# Patient Record
Sex: Male | Born: 1975 | Race: Black or African American | Hispanic: No | Marital: Married | Smoking: Never smoker
Health system: Southern US, Community
[De-identification: ages and names within clinical notes are randomized; demographics above are authoritative.]

## PROBLEM LIST (undated history)

## (undated) DIAGNOSIS — I10 Essential (primary) hypertension: Secondary | ICD-10-CM

## (undated) DIAGNOSIS — E7800 Pure hypercholesterolemia, unspecified: Secondary | ICD-10-CM

## (undated) DIAGNOSIS — I219 Acute myocardial infarction, unspecified: Secondary | ICD-10-CM

## (undated) DIAGNOSIS — R918 Other nonspecific abnormal finding of lung field: Secondary | ICD-10-CM

## (undated) DIAGNOSIS — J309 Allergic rhinitis, unspecified: Secondary | ICD-10-CM

## (undated) HISTORY — DX: Other nonspecific abnormal finding of lung field: R91.8

## (undated) HISTORY — DX: Pure hypercholesterolemia, unspecified: E78.00

## (undated) HISTORY — DX: Allergic rhinitis, unspecified: J30.9

---

## 2007-03-23 HISTORY — PX: LUNG BIOPSY: SHX232

## 2008-03-22 HISTORY — PX: CARDIAC CATHETERIZATION: SHX172

## 2008-03-22 HISTORY — PX: OTHER SURGICAL HISTORY: SHX169

## 2009-11-11 ENCOUNTER — Emergency Department (HOSPITAL_COMMUNITY): Admission: EM | Admit: 2009-11-11 | Discharge: 2009-11-11 | Payer: Self-pay | Admitting: Family Medicine

## 2010-02-26 ENCOUNTER — Observation Stay (HOSPITAL_COMMUNITY)
Admission: EM | Admit: 2010-02-26 | Discharge: 2010-02-28 | Payer: Self-pay | Source: Home / Self Care | Attending: Internal Medicine | Admitting: Internal Medicine

## 2010-02-27 ENCOUNTER — Encounter (INDEPENDENT_AMBULATORY_CARE_PROVIDER_SITE_OTHER): Payer: Self-pay | Admitting: Internal Medicine

## 2010-03-03 ENCOUNTER — Ambulatory Visit: Payer: Self-pay | Admitting: Cardiovascular Disease

## 2010-06-02 LAB — LIPID PANEL
LDL Cholesterol: 127 mg/dL — ABNORMAL HIGH (ref 0–99)
Triglycerides: 213 mg/dL — ABNORMAL HIGH (ref <150)
VLDL: 28 mg/dL (ref 0–40)
VLDL: 43 mg/dL — ABNORMAL HIGH (ref 0–40)

## 2010-06-02 LAB — BASIC METABOLIC PANEL
Calcium: 9.9 mg/dL (ref 8.4–10.5)
GFR calc Af Amer: >60 mL/min (ref >60)
GFR calc non Af Amer: >60 mL/min (ref >60)
Sodium: 134 mEq/L — ABNORMAL LOW (ref 135–145)

## 2010-06-02 LAB — CK TOTAL AND CKMB (NOT AT ARMC)
CK, MB: 2.1 ng/mL (ref 0.3–4.0)
CK, MB: 2.6 ng/mL (ref 0.3–4.0)
CK, MB: 3.1 ng/mL (ref 0.3–4.0)
Relative Index: 1 (ref 0.0–2.5)
Total CK: 212 U/L (ref 7–232)
Total CK: 229 U/L (ref 7–232)
Total CK: 263 U/L — ABNORMAL HIGH (ref 7–232)

## 2010-06-02 LAB — DIFFERENTIAL
Lymphs Abs: 3.3 10*3/uL (ref 0.7–4.0)
Monocytes Relative: 9 % (ref 3–12)
Neutro Abs: 1.5 10*3/uL — ABNORMAL LOW (ref 1.7–7.7)
Neutrophils Relative %: 28 % — ABNORMAL LOW (ref 43–77)

## 2010-06-02 LAB — URINALYSIS, MICROSCOPIC ONLY
Bilirubin Urine: NEGATIVE
Glucose, UA: NEGATIVE mg/dL
Hgb urine dipstick: NEGATIVE
Ketones, ur: NEGATIVE mg/dL
Leukocytes, UA: NEGATIVE
Protein, ur: NEGATIVE mg/dL

## 2010-06-02 LAB — CBC
Hemoglobin: 14.1 g/dL (ref 13.0–17.0)
RBC: 5.1 MIL/uL (ref 4.22–5.81)

## 2010-06-02 LAB — RAPID URINE DRUG SCREEN, HOSP PERFORMED: Benzodiazepines: NOT DETECTED

## 2010-06-02 LAB — POCT CARDIAC MARKERS
CKMB, poc: 2.7 ng/mL (ref 1.0–8.0)
Myoglobin, poc: 87.9 ng/mL (ref 12–200)

## 2010-06-02 LAB — TROPONIN I: Troponin I: 0.01 ng/mL (ref 0.00–0.06)

## 2010-06-05 LAB — GC/CHLAMYDIA PROBE AMP, GENITAL
Chlamydia, DNA Probe: POSITIVE — AB
GC Probe Amp, Genital: NEGATIVE

## 2010-07-09 ENCOUNTER — Emergency Department (HOSPITAL_COMMUNITY)
Admission: EM | Admit: 2010-07-09 | Discharge: 2010-07-10 | Disposition: A | Discharge status: Home / Self Care | Payer: PRIVATE HEALTH INSURANCE | Attending: Emergency Medicine | Admitting: Emergency Medicine

## 2010-07-09 ENCOUNTER — Emergency Department (HOSPITAL_COMMUNITY): Payer: PRIVATE HEALTH INSURANCE

## 2010-07-09 DIAGNOSIS — Z79899 Other long term (current) drug therapy: Secondary | ICD-10-CM | POA: Insufficient documentation

## 2010-07-09 DIAGNOSIS — R0602 Shortness of breath: Secondary | ICD-10-CM | POA: Insufficient documentation

## 2010-07-09 DIAGNOSIS — R0989 Other specified symptoms and signs involving the circulatory and respiratory systems: Secondary | ICD-10-CM | POA: Insufficient documentation

## 2010-07-09 DIAGNOSIS — I10 Essential (primary) hypertension: Secondary | ICD-10-CM | POA: Insufficient documentation

## 2010-07-09 DIAGNOSIS — R0609 Other forms of dyspnea: Secondary | ICD-10-CM | POA: Insufficient documentation

## 2010-07-09 DIAGNOSIS — R079 Chest pain, unspecified: Secondary | ICD-10-CM | POA: Insufficient documentation

## 2010-07-09 LAB — POCT I-STAT, CHEM 8
HCT: 41 % (ref 39.0–52.0)
Hemoglobin: 13.9 g/dL (ref 13.0–17.0)
Potassium: 3.9 mEq/L (ref 3.5–5.1)
Sodium: 141 mEq/L (ref 135–145)
TCO2: 27 mmol/L (ref 0–100)

## 2010-07-09 LAB — POCT CARDIAC MARKERS
CKMB, poc: 1.2 ng/mL (ref 1.0–8.0)
Myoglobin, poc: 75 ng/mL (ref 12–200)

## 2010-07-10 LAB — CK TOTAL AND CKMB (NOT AT ARMC)
CK, MB: 2.2 ng/mL (ref 0.3–4.0)
Relative Index: 0.8 (ref 0.0–2.5)
Total CK: 264 U/L — ABNORMAL HIGH (ref 7–232)

## 2010-07-10 LAB — TROPONIN I: Troponin I: 0.01 ng/mL (ref 0.00–0.06)

## 2011-06-14 ENCOUNTER — Other Ambulatory Visit: Payer: Self-pay

## 2011-06-14 ENCOUNTER — Encounter (HOSPITAL_COMMUNITY): Payer: Self-pay | Admitting: *Deleted

## 2011-06-14 ENCOUNTER — Inpatient Hospital Stay (HOSPITAL_COMMUNITY)
Admission: EM | Admit: 2011-06-14 | Discharge: 2011-06-16 | DRG: 558 | Disposition: A | Discharge status: Home / Self Care | Payer: PRIVATE HEALTH INSURANCE | Attending: Internal Medicine | Admitting: Internal Medicine

## 2011-06-14 DIAGNOSIS — Z79899 Other long term (current) drug therapy: Secondary | ICD-10-CM

## 2011-06-14 DIAGNOSIS — R9431 Abnormal electrocardiogram [ECG] [EKG]: Secondary | ICD-10-CM | POA: Diagnosis present

## 2011-06-14 DIAGNOSIS — I10 Essential (primary) hypertension: Secondary | ICD-10-CM | POA: Diagnosis present

## 2011-06-14 DIAGNOSIS — E871 Hypo-osmolality and hyponatremia: Secondary | ICD-10-CM | POA: Diagnosis present

## 2011-06-14 DIAGNOSIS — M6282 Rhabdomyolysis: Principal | ICD-10-CM | POA: Diagnosis present

## 2011-06-14 DIAGNOSIS — R42 Dizziness and giddiness: Secondary | ICD-10-CM | POA: Diagnosis present

## 2011-06-14 DIAGNOSIS — E860 Dehydration: Secondary | ICD-10-CM | POA: Diagnosis present

## 2011-06-14 HISTORY — DX: Essential (primary) hypertension: I10

## 2011-06-14 LAB — CARDIAC PANEL(CRET KIN+CKTOT+MB+TROPI)
CK, MB: 4.4 ng/mL — ABNORMAL HIGH (ref 0.3–4.0)
Relative Index: 0.1 (ref 0.0–2.5)
Total CK: 3083 U/L — ABNORMAL HIGH (ref 7–232)
Troponin I: <0.3 ng/mL

## 2011-06-14 LAB — BASIC METABOLIC PANEL
CO2: 30 mEq/L (ref 19–32)
Chloride: 98 mEq/L (ref 96–112)
GFR calc Af Amer: >90 mL/min (ref >90)
Potassium: 3.8 mEq/L (ref 3.5–5.1)
Sodium: 133 mEq/L — ABNORMAL LOW (ref 135–145)

## 2011-06-14 LAB — CBC
HCT: 39.7 % (ref 39.0–52.0)
Hemoglobin: 13.2 g/dL (ref 13.0–17.0)
RBC: 4.81 MIL/uL (ref 4.22–5.81)
WBC: 8.8 10*3/uL (ref 4.0–10.5)

## 2011-06-14 LAB — CK: Total CK: 3528 U/L — ABNORMAL HIGH (ref 7–232)

## 2011-06-14 MED ORDER — ONDANSETRON HCL 4 MG PO TABS: 4.0000 mg | ORAL_TABLET | Freq: Four times a day (QID) | ORAL | Status: DC | PRN

## 2011-06-14 MED ORDER — VERAPAMIL HCL ER 240 MG PO TBCR
240.0000 mg | EXTENDED_RELEASE_TABLET | Freq: Every day | ORAL | Status: DC
  Administered 2011-06-14 – 2011-06-15 (×2): 240 mg via ORAL
  Filled 2011-06-14 (×3): qty 1

## 2011-06-14 MED ORDER — OXYCODONE HCL 5 MG PO TABS
5.0000 mg | ORAL_TABLET | ORAL | Status: DC | PRN
  Administered 2011-06-14 – 2011-06-16 (×6): 5 mg via ORAL
  Filled 2011-06-14 (×6): qty 1

## 2011-06-14 MED ORDER — ACETAMINOPHEN 325 MG PO TABS: 650.0000 mg | ORAL_TABLET | Freq: Four times a day (QID) | ORAL | Status: DC | PRN

## 2011-06-14 MED ORDER — ONDANSETRON HCL 4 MG/2ML IJ SOLN: 4.0000 mg | Freq: Four times a day (QID) | INTRAMUSCULAR | Status: DC | PRN

## 2011-06-14 MED ORDER — ONDANSETRON HCL 4 MG/2ML IJ SOLN
4.0000 mg | Freq: Once | INTRAMUSCULAR | Status: AC
  Administered 2011-06-14: 4 mg via INTRAVENOUS
  Filled 2011-06-14: qty 2

## 2011-06-14 MED ORDER — SODIUM CHLORIDE 0.9 % IV SOLN
INTRAVENOUS | Status: DC
  Administered 2011-06-14 – 2011-06-16 (×4): via INTRAVENOUS

## 2011-06-14 MED ORDER — ASPIRIN EC 81 MG PO TBEC
81.0000 mg | DELAYED_RELEASE_TABLET | Freq: Every day | ORAL | Status: DC
  Administered 2011-06-14 – 2011-06-16 (×3): 81 mg via ORAL
  Filled 2011-06-14 (×3): qty 1

## 2011-06-14 MED ORDER — ENOXAPARIN SODIUM 40 MG/0.4ML ~~LOC~~ SOLN
40.0000 mg | SUBCUTANEOUS | Status: DC
  Filled 2011-06-14: qty 0.4

## 2011-06-14 MED ORDER — ENOXAPARIN SODIUM 40 MG/0.4ML ~~LOC~~ SOLN
40.0000 mg | SUBCUTANEOUS | Status: DC
  Filled 2011-06-14 (×3): qty 0.4

## 2011-06-14 MED ORDER — ALUM & MAG HYDROXIDE-SIMETH 200-200-20 MG/5ML PO SUSP: 30.0000 mL | Freq: Four times a day (QID) | ORAL | Status: DC | PRN

## 2011-06-14 MED ORDER — ACETAMINOPHEN 650 MG RE SUPP: 650.0000 mg | Freq: Four times a day (QID) | RECTAL | Status: DC | PRN

## 2011-06-14 MED ORDER — SODIUM CHLORIDE 0.9 % IV BOLUS (SEPSIS)
1000.0000 mL | Freq: Once | INTRAVENOUS | Status: AC
  Administered 2011-06-14: 1000 mL via INTRAVENOUS

## 2011-06-14 NOTE — ED Notes (Signed)
Given [place] sandwich, fruit cup, and graham crackers

## 2011-06-14 NOTE — ED Provider Notes (Signed)
History     CSN: [id number]  Arrival date & time 06/14/11  1255   First MD Initiated Contact with Patient 06/14/11 1334      Chief Complaint  Patient presents with  . Near Syncope    (Consider location/radiation/quality/duration/timing/severity/associated sxs/prior treatment) The history is provided by the patient.   the patient is a 36 year old, male, with a history of hypertension, who presents to emergency department complaining of dizziness and nausea.  After he was working out earlier today.  He denies pain anywhere.  He says he was sweating profusely.  He became lightheaded, and dizzy.  He denies vomiting.  He denies recent illness.  He did not actually pass out.  He denies a headache, or neck pain.  Past Medical History  Diagnosis Date  . Hypertension     No past surgical history on file.  No family history on file.  History  Substance Use Topics  . Smoking status: Not on file  . Smokeless tobacco: Not on file  . Alcohol Use:       Review of Systems  HENT: Negative for neck pain and neck stiffness.   Eyes: Negative for visual disturbance.  Gastrointestinal: Positive for nausea. Negative for vomiting and abdominal pain.  Skin: Negative for rash.  Neurological: Positive for dizziness and light-headedness. Negative for headaches.  Psychiatric/Behavioral: Negative for confusion.  All other systems reviewed and are negative.    Allergies  Chloroquine; Lisinopril; and Sumatriptan  Home Medications   Current Outpatient Rx  Name Route Sig Dispense Refill  . IBUPROFEN 200 MG PO TABS Oral Take 600 mg by mouth every 6 (six) hours as needed. For pain    . OLMESARTAN MEDOXOMIL-HCTZ 40-25 MG PO TABS Oral Take 1 tablet by mouth daily.    Marland Kitchen VERAPAMIL HCL ER (CO) 240 MG PO TB24 Oral Take 240 mg by mouth at bedtime.      BP 121/72  Pulse 76  Temp(Src) 97.7 F (36.5 C) (Oral)  Resp 16  SpO2 98%  Physical Exam  Vitals reviewed. Constitutional: He is oriented to  person, place, and time. He appears well-developed and well-nourished.  HENT:  Head: Normocephalic and atraumatic.  Eyes: Conjunctivae and EOM are normal. Pupils are equal, round, and reactive to light.  Neck: Normal range of motion. Neck supple.       No nuchal rigidity  Cardiovascular: Normal rate.   No murmur heard. Pulmonary/Chest: Effort normal. No respiratory distress. He has no wheezes. He has no rales.  Abdominal: Soft. He exhibits no distension. There is no tenderness.  Musculoskeletal: Normal range of motion.  Neurological: He is alert and oriented to person, place, and time. No cranial nerve deficit.  Skin: Skin is warm and dry.  Psychiatric: He has a normal mood and affect. Thought content normal.    ED Course  Procedures (including critical care time)   Labs Reviewed  BASIC METABOLIC PANEL  CK   No results found.   No diagnosis found.  ED ECG REPORT   Date: 06/14/2011  EKG Time: 3:26 PM  Rate: 78  Rhythm: normal sinus rhythm,    Axis: nl  Intervals:first-degree A-V block   ST&T Change: nonspecific tw changes  Narrative Interpretation: nsr with 1st degree av block and nonspecific tw changes  3:27 PM Spoke with [facility]. She will admit for further eval and tx            MDM  Rhabdomyolysis without renal insufficiency        Tinnie Gens  Nayelie Gionfriddo, MD 06/14/11 1527

## 2011-06-14 NOTE — Progress Notes (Signed)
Pt confirms Dr Tally Joe as pcp

## 2011-06-14 NOTE — ED Notes (Signed)
Pt reports he was at the gym, finished working out.  States he was sitting, and states that he could not stand up when he tried to. Then pt reports laying on the floor.   Pt reports dizziness and near syncopal episode.  Pt denies cp at this time, reports h/a.

## 2011-06-14 NOTE — ED Notes (Signed)
Per EMS, pt was at the gym, had near syncopal episode.

## 2011-06-14 NOTE — H&P (Signed)
PCP:   [name], MD, MD   Chief Complaint:  generalised weakness and unable to get up from the floor.   HPI: 36 year old gentleman with no significant past medical history was exercising this morning, when he felt weak, and could not get up from the floor. He also reports feeling dizziness and felt faint. On arrival he was found to have elevated CK levels and abnormal EKG , with t wave inversions in lead III and avf.   Review of Systems:  The patient denies anorexia, fever, weight loss,, vision loss, decreased hearing, hoarseness, chest pain, syncope, dyspnea on exertion, peripheral edema, balance deficits, hemoptysis, abdominal pain, melena, hematochezia, severe indigestion/heartburn, hematuria, incontinence, genital sores, suspicious skin lesions, transient blindness, depression, unusual weight change, abnormal bleeding, enlarged lymph nodes, angioedema, and breast masses.  Past Medical History: Past Medical History  Diagnosis Date  . Hypertension    History reviewed. No pertinent past surgical history.  Medications: Prior to Admission medications   Medication Sig Start Date End Date Taking? Authorizing Provider  ibuprofen (ADVIL,MOTRIN) 200 MG tablet Take 600 mg by mouth every 6 (six) hours as needed. For pain   Yes Historical Provider, MD  olmesartan-hydrochlorothiazide (BENICAR HCT) 40-25 MG per tablet Take 1 tablet by mouth daily.   Yes Historical Provider, MD  verapamil (COVERA HS) 240 MG (CO) 24 hr tablet Take 240 mg by mouth at bedtime.   Yes Historical Provider, MD    Allergies:   Allergies  Allergen Reactions  . Chloroquine   . Lisinopril   . Sumatriptan Other (See Comments)    Chest pains    Social History:  reports that he has never smoked. He has never used smokeless tobacco. He reports that he does not drink alcohol or use illicit drugs.  Family History: History reviewed. No pertinent family history.  Physical Exam: Filed Vitals:   06/14/11 1307  BP:  121/72  Pulse: 76  Temp: 97.7 F (36.5 C)  TempSrc: Oral  Resp: 16  SpO2: 98%   Constitutional: Vital signs reviewed.  Patient is a well-developed and well-nourished  in no acute distress and cooperative with exam. Alert and oriented x3.  Head: Normocephalic and atraumatic Ear: TM normal bilaterally Mouth: no erythema or exudates, dry MM Eyes: PERRL, EOMI, conjunctivae normal, No scleral icterus.  Neck: Supple, Trachea midline normal ROM, No JVD, mass, thyromegaly, or carotid bruit present.  Cardiovascular: RRR, S1 normal, S2 normal, no MRG, pulses symmetric and intact bilaterally Pulmonary/Chest: CTAB, no wheezes, rales, or rhonchi Abdominal: Soft. Non-tender, non-distended, bowel sounds are normal, no masses, organomegaly, or guarding present.  GU: no CVA tenderness Musculoskeletal: No joint deformities, erythema, or stiffness, ROM full and no nontender Neurological: A&O x3, Strenght is normal and symmetric bilaterally, cranial nerve II-XII are grossly intact, no focal motor deficit, sensory intact to light touch bilaterally.  Skin: Warm, dry and intact. No rash, cyanosis, or clubbing.  Psychiatric: Normal mood and affect.     Labs on Admission:   Basename 06/14/11 1400  NA 133*  K 3.8  CL 98  CO2 30  GLUCOSE 73  BUN 13  CREATININE 1.07  CALCIUM 9.8  MG --  PHOS --   No results found for this basename: AST:2,ALT:2,ALKPHOS:2,BILITOT:2,PROT:2,ALBUMIN:2 in the last 72 hours No results found for this basename: LIPASE:2,AMYLASE:2 in the last 72 hours  Basename 06/14/11 1400  WBC 8.8  NEUTROABS --  HGB 13.2  HCT 39.7  MCV 82.5  PLT 287    Basename 06/14/11  1400  CKTOTAL 3528*  CKMB --  CKMBINDEX --  TROPONINI --   No results found for this basename: TSH,T4TOTAL,FREET3,T3FREE,THYROIDAB in the last 72 hours No results found for this basename: VITAMINB12:2,FOLATE:2,FERRITIN:2,TIBC:2,IRON:2,RETICCTPCT:2 in the last 72 hours  Radiological Exams on Admission: No  results found.  Assessment/Plan Present on Admission:  Dehydration: started him on IV fluids. Rhabdomyolysis: elevated ck levels consistent with rhabdomyolysis which contributed to the generalized weakness. Will hydrate him and repeat levels in am.  Hyponatremia: probably secondary to dehydration. On IV fluids. Repeat levels in am. .Abnormal EKG: t wave inversions without any chest pain. Will get serial enzymes and repeat EKG IN am.  2decho in 2011 showed good left ventricular systolic function.  DVT prophylaxis.  After discussion with the patient, he is to be a full code  We will respect these wishes.  Time spent on this patient including examination and decision-making process: 60 minutes.  Kathlen Mody [phone] 06/14/2011, 4:42 PM

## 2011-06-15 ENCOUNTER — Other Ambulatory Visit: Payer: Self-pay

## 2011-06-15 LAB — CBC
HCT: 38.3 % — ABNORMAL LOW (ref 39.0–52.0)
MCHC: 32.1 g/dL (ref 30.0–36.0)
MCV: 84.2 fL (ref 78.0–100.0)
Platelets: 254 10*3/uL (ref 150–400)
RDW: 13.6 % (ref 11.5–15.5)
WBC: 5.9 10*3/uL (ref 4.0–10.5)

## 2011-06-15 LAB — COMPREHENSIVE METABOLIC PANEL
ALT: 30 U/L (ref 0–53)
Alkaline Phosphatase: 30 U/L — ABNORMAL LOW (ref 39–117)
BUN: 15 mg/dL (ref 6–23)
Chloride: 103 mEq/L (ref 96–112)
GFR calc Af Amer: >90 mL/min (ref >90)
Glucose, Bld: 99 mg/dL (ref 70–99)
Potassium: 3.6 mEq/L (ref 3.5–5.1)
Sodium: 137 mEq/L (ref 135–145)
Total Bilirubin: 0.2 mg/dL — ABNORMAL LOW (ref 0.3–1.2)
Total Protein: 6.6 g/dL (ref 6.0–8.3)

## 2011-06-15 LAB — CARDIAC PANEL(CRET KIN+CKTOT+MB+TROPI)
CK, MB: 3.9 ng/mL (ref 0.3–4.0)
Total CK: 2454 U/L — ABNORMAL HIGH (ref 7–232)
Troponin I: <0.3 ng/mL (ref <0.30)

## 2011-06-15 LAB — CK: Total CK: 1454 U/L — ABNORMAL HIGH (ref 7–232)

## 2011-06-15 MED ORDER — OLMESARTAN MEDOXOMIL-HCTZ 40-25 MG PO TABS
1.0000 | ORAL_TABLET | Freq: Every day | ORAL | Status: DC
  Administered 2011-06-16: 1 via ORAL

## 2011-06-15 NOTE — Progress Notes (Addendum)
Subjective: Dizziness when walking to the bathroom. Feels weak.   Objective: Weight change:   Intake/Output Summary (Last 24 hours) at 06/15/11 1944 Last data filed at 06/15/11 1855  Gross per 24 hour  Intake 3627.92 ml  Output      0 ml  Net 3627.92 ml    Filed Vitals:   06/15/11 1554  BP: 158/82  Pulse: 66  Temp: 97.5 F (36.4 C)  Resp: 16   On exam  He is alert afebrile comfortable CVS S1 S2 HEARD Lungs clear Abdomen : soft non tender non distended bowel sounds heard Extremities: no pedal edema cyanosis or clubbing.   Lab Results: Results for orders placed during the hospital encounter of 06/14/11 (from the past 24 hour(s))  COMPREHENSIVE METABOLIC PANEL     Status: Abnormal   Collection Time   06/15/11  3:20 AM      Component Value Range   Sodium 137  135 - 145 (mEq/L)   Potassium 3.6  3.5 - 5.1 (mEq/L)   Chloride 103  96 - 112 (mEq/L)   CO2 28  19 - 32 (mEq/L)   Glucose, Bld 99  70 - 99 (mg/dL)   BUN 15  6 - 23 (mg/dL)   Creatinine, Ser 8.11  0.50 - 1.35 (mg/dL)   Calcium 9.2  8.4 - 91.4 (mg/dL)   Total Protein 6.6  6.0 - 8.3 (g/dL)   Albumin 3.6  3.5 - 5.2 (g/dL)   AST 38 (*) 0 - 37 (U/L)   ALT 30  0 - 53 (U/L)   Alkaline Phosphatase 30 (*) 39 - 117 (U/L)   Total Bilirubin 0.2 (*) 0.3 - 1.2 (mg/dL)   GFR calc non Af Amer 81 (*) >90 (mL/min)   GFR calc Af Amer >90  >90 (mL/min)  CBC     Status: Abnormal   Collection Time   06/15/11  3:20 AM      Component Value Range   WBC 5.9  4.0 - 10.5 (K/uL)   RBC 4.55  4.22 - 5.81 (MIL/uL)   Hemoglobin 12.3 (*) 13.0 - 17.0 (g/dL)   HCT 78.2 (*) 95.6 - 52.0 (%)   MCV 84.2  78.0 - 100.0 (fL)   MCH 27.0  26.0 - 34.0 (pg)   MCHC 32.1  30.0 - 36.0 (g/dL)   RDW 21.3  08.6 - 57.8 (%)   Platelets 254  150 - 400 (K/uL)  PROTIME-INR     Status: Normal   Collection Time   06/15/11  3:20 AM      Component Value Range   Prothrombin Time 13.8  11.6 - 15.2 (seconds)   INR 1.04  0.00 - 1.49   APTT     Status: Normal   Collection Time   06/15/11  3:20 AM      Component Value Range   aPTT 32  24 - 37 (seconds)  CK     Status: Abnormal   Collection Time   06/15/11  3:20 AM      Component Value Range   Total CK 2561 (*) 7 - 232 (U/L)  CARDIAC PANEL(CRET KIN+CKTOT+MB+TROPI)     Status: Abnormal   Collection Time   06/15/11  3:20 AM      Component Value Range   Total CK 2454 (*) 7 - 232 (U/L)   CK, MB 3.9  0.3 - 4.0 (ng/mL)   Troponin I <0.30  <0.30 (ng/mL)   Relative Index 0.2  0.0 - 2.5   CARDIAC PANEL(CRET  KIN+CKTOT+MB+TROPI)     Status: Abnormal   Collection Time   06/15/11 11:15 AM      Component Value Range   Total CK 1929 (*) 7 - 232 (U/L)   CK, MB 3.4  0.3 - 4.0 (ng/mL)   Troponin I <0.30  <0.30 (ng/mL)   Relative Index 0.2  0.0 - 2.5      Micro Results: No results found for this or any previous visit (from the past 240 hour(s)).  Studies/Results: No results found. Medications: Scheduled Meds:   . aspirin EC  81 mg Oral Daily  . enoxaparin  40 mg Subcutaneous Q24H  . verapamil  240 mg Oral QHS   Continuous Infusions:   . sodium chloride 125 mL/hr at 06/15/11 1055   PRN Meds:.acetaminophen, acetaminophen, alum & mag hydroxide-simeth, ondansetron (ZOFRAN) IV, ondansetron, oxyCODONE  Assessment/Plan:   Initial Presentation:   36 year old gentleman with no significant past medical history was exercising this morning, when he felt weak, and could not get up from the floor. He also reports feeling dizziness and felt faint. On arrival he was found to have elevated CK levels and abnormal EKG , with t wave inversions in lead III and avf. He is chest pain free, no sob. He was admitted for IV hydration and for evaluation of abnormal EKG.   Rhabdomyolysis (06/14/2011):  - Improving CK levels. Continue with IV hydration. As per the patient he is still having spells of dizziness on walking and feels cramps in his lower extremities.  Dehydration (06/14/2011): - Continue with  hydration Hyponatremia (06/14/2011);  -most likely secondary to dehydration and it has resolved Abnormal EKG (06/14/2011):  - repeat EKG pending. Cardiac enzymes were negative.   DVT prophylaxis FULL CODE Disposition: possible d/c in am when CK levels and symptoms have improved.     LOS: 1 day   Jordell Outten 06/15/2011, 7:44 PM

## 2011-06-16 LAB — CBC
HCT: 38 % — ABNORMAL LOW (ref 39.0–52.0)
Hemoglobin: 12.2 g/dL — ABNORMAL LOW (ref 13.0–17.0)
MCV: 83.9 fL (ref 78.0–100.0)
RBC: 4.53 MIL/uL (ref 4.22–5.81)
WBC: 5.4 10*3/uL (ref 4.0–10.5)

## 2011-06-16 MED ORDER — OLMESARTAN MEDOXOMIL-HCTZ 40-25 MG PO TABS: 1.0000 | ORAL_TABLET | Freq: Every day | ORAL | Status: DC

## 2011-06-16 MED ORDER — VERAPAMIL HCL ER 240 MG PO TBCR
240.0000 mg | EXTENDED_RELEASE_TABLET | Freq: Every day | ORAL | Status: DC
  Administered 2011-06-16: 240 mg via ORAL
  Filled 2011-06-16: qty 1

## 2011-06-16 NOTE — Progress Notes (Signed)
Pt complaining of dizziness once again. Vitals BP 162/99, 74. 16 Pt had received Oxycodone 5mg  one hour before onset of symptoms.  Dr. Jomarie Longs notified of situation, pt educated on possible effects of medication and advised to get plenty of rest and drink a lot of liquid.    Kathie Dike D

## 2011-06-16 NOTE — Discharge Summary (Signed)
Physician Discharge Summary  Patient ID: Dennis Garza MRN: [id number] DOB/AGE: 08/06/1975 36 y.o.  Admit date: 06/14/2011 Discharge date: 06/16/2011  Primary Care Physician:  [name], MD, MD  Discharge Diagnoses:   1. Rhabdomyolysis 2. Dehydration 3. HTN 4. Hyponatremia 5. LVH on EKG  Medication List  As of 06/16/2011  2:27 PM   STOP taking these medications         ibuprofen 200 MG tablet         TAKE these medications         olmesartan-hydrochlorothiazide 40-25 MG per tablet   Commonly known as: BENICAR HCT   Take 1 tablet by mouth daily. Restart in 1 week      verapamil 240 MG (CO) 24 hr tablet   Commonly known as: COVERA HS   Take 240 mg by mouth at bedtime.           Disposition and Follow-up:  1.pcp IN 1 WEEK 2. 2D echo   Significant Diagnostic Studies:  No results found.  Brief H and P:36 year old gentleman with no significant past medical history was exercising this morning, when he felt weak, and could not get up from the floor. He also reports feeling dizziness and felt faint. On arrival he was found to have elevated CK levels and abnormal EKG , with t wave inversions in lead III and avf.   Hospital Course:  1. Rhabdomyolysis: this was felt to be secondary to excessive exertion/weight training, compounded by dehydration and being on a diuretic. His CK level was 3500 on admission which improved with hydration to 1400 today.He is advised to drink plenty of fluids for next 3-4days and avoid excessive exertion.He is also advised to hold of restarting his diuretic for the next week. 2. Abnormal EKG: Initial EKG on admission showed was concerning for inverted t waves in inferior leads, and LVH, he ruled out for ACS based on negative troponins and CKMB. And has been asymptomatic from this standpoint. Repeat EKG showed evidence of LVH and flat T waves in infero-lateral leads which was felt to be reciprocal changes from LVH. He is advised to have a follow up  echo in next 1-2 weeks.   Time spent on Discharge:  Signed: Kamani Lewter [facility]  06/16/2011, 2:27 PM

## 2011-07-21 ENCOUNTER — Encounter (INDEPENDENT_AMBULATORY_CARE_PROVIDER_SITE_OTHER): Payer: Self-pay | Admitting: Surgery

## 2011-07-21 ENCOUNTER — Ambulatory Visit (INDEPENDENT_AMBULATORY_CARE_PROVIDER_SITE_OTHER): Payer: PRIVATE HEALTH INSURANCE | Admitting: Surgery

## 2011-07-21 VITALS — BP 142/84 | HR 72 | Temp 97.6°F | Resp 16 | Ht 68.0 in | Wt 205.2 lb

## 2011-07-21 DIAGNOSIS — M6282 Rhabdomyolysis: Secondary | ICD-10-CM

## 2011-07-21 DIAGNOSIS — M6281 Muscle weakness (generalized): Secondary | ICD-10-CM | POA: Insufficient documentation

## 2011-07-21 DIAGNOSIS — K429 Umbilical hernia without obstruction or gangrene: Secondary | ICD-10-CM | POA: Insufficient documentation

## 2011-07-21 NOTE — Patient Instructions (Signed)
Myopathy Myopathy is a general term. It refers to any diseases of the muscles. The muscular dystrophies that run in families are one example. Another example are those diseases that produce redness, soreness, and swelling (inflammation) in the muscles. CAUSES  Myopathies can be acquired or passed down from parent to child (hereditary). It is unknown what causes the myopathies with inflammation. Myopathies can occur at birth or later in life. They may be caused by:  Endocrine disorders, such as thyroid disease.   Metabolic disorders, which are usually inherited.   Infection or inflammation of the muscle. This is often triggered by viruses or an immune system that attacks the muscles.   Certain drugs, such as lipid-lowering medicines.  SYMPTOMS  General symptoms include weakness or pain of the limbs. These feelings are usually present close to the center of the body (proximal). Some people report that their myopathy happens during exercise. In some cases, the symptoms decrease as exercise increases. Depending upon the type, one muscle group may be more affected than another. In some cases, people have a myopathy with no symptoms. In the inherited myopathies, some family members may not be affected by symptoms. Other family members may have a range of symptoms. DIAGNOSIS  Diagnosis is based on your exam and symptoms.  Often, blood tests will be done. This is to measure muscle enzyme levels.   A test may be done to measure electrical activity of the muscle (electromyography, EMG).   A tissue sample (biopsy) of the affected muscle may be taken.   A computerized magnetic scan (MRI) may also be performed.  TREATMENT  Treatments vary depending on the type of myopathy. In some cases, treatment to relieve symptoms may be all that is available or needed. Treatment for other forms of myopathy may include medicines. Immunosuppressive drugs and other disease-modifying antirheumatic drugs (DMARDs) may be  used. Physical therapy, bracing, or surgery may be needed. HOME CARE INSTRUCTIONS   Certain diets and exercises may be encouraged depending on the type of myopathy.   Sun exposure may be discouraged as it can cause rashes.   Physical therapy with a muscle strengthening program may be advised.   It is important to practice good general health to maintain a normal body weight.  SEEK IMMEDIATE MEDICAL CARE IF:   You develop breathing problems.   You develop a rash.   You have a fever.  FOR MORE INFORMATION  National Institute of Neurological Disorders and Stroke: [employer] Document Released: 02/26/2002 Document Revised: 02/25/2011 Document Reviewed: 06/19/2009 [facility] Patient Information 2012 [place], [place].

## 2011-07-21 NOTE — Progress Notes (Addendum)
Subjective:     Patient ID: Dennis Garza, male   DOB: Apr 04, 1975, 36 y.o.   MRN: [id number]  HPI  Dennis Garza  January 23, 1976 [id number]  Patient Care Team: [name], MD as PCP - General (Family Medicine) [name], MD as Consulting Physician (Rheumatology)   This patient is a 36 y.o.male who presents today for surgical evaluation at the request of Dr. [name].   Reason for evaluation: Intermittent muscle weakness.  Possible myositis.  Need for muscle biopsy.  Patient is a formerly athletic male who's had intermittent episodes of muscle weakness for the past few years.  He often feels weakness in his thighs.  It tends to avoid intense exercise last happen again.  No fevers chills or sweats.  He well.  No joint pains that he recalls.  He's underwent a rather thorough evaluation by Dr. [name].  She was concerned about the possibility of myositis.  Had some muscle pain and elevated CK blood levels.  She recommended muscle biopsy.   Patient Active Problem List  Diagnosis  . Dehydration  . Rhabdomyolysis  . Hyponatremia  . Abnormal EKG  . Proximal muscle weakness, possible myositis  . Umbilical hernia, 1cm reducible  . Hypertrophic scarring    Past Medical History  Diagnosis Date  . Hypertension   . Migraine   . Hypercholesterolemia     Past Surgical History  Procedure Date  . Lung biopsy 2009    right  . Cardiac catherization 2010    History   Social History  . Marital Status: Married    Spouse Name: N/A    Number of Children: N/A  . Years of Education: N/A   Occupational History  . Not on file.   Social History Main Topics  . Smoking status: Never Smoker   . Smokeless tobacco: Never Used  . Alcohol Use: Yes     once a month  . Drug Use: No  . Sexually Active: No   Other Topics Concern  . Not on file   Social History Narrative  . No narrative on file    Family History  Problem Relation Age of Onset  . Cancer Maternal Aunt     breast     Current Outpatient Prescriptions  Medication Sig Dispense Refill  . EPINEPHrine (EPIPEN JR) 0.15 MG/0.3ML injection Inject 0.15 mg into the muscle as needed.      Marland Kitchen HYDROcodone-acetaminophen (VICODIN) 5-500 MG per tablet Take 1 tablet by mouth as needed.      . Omega-3 Fatty Acids (FISH OIL) 1000 MG CAPS Take by mouth daily.      . traMADol (ULTRAM) 50 MG tablet Take 50 mg by mouth as needed.      . Multiple Vitamins-Minerals (MULTIVITAMIN WITH MINERALS) tablet Take 1 tablet by mouth daily.      Marland Kitchen olmesartan-hydrochlorothiazide (BENICAR HCT) 40-25 MG per tablet Take 1 tablet by mouth daily. Restart in 1 week      . verapamil (COVERA HS) 240 MG (CO) 24 hr tablet Take 240 mg by mouth at bedtime.         Allergies  Allergen Reactions  . Lisinopril Other (See Comments)    Hypotension  . Shellfish Allergy Anaphylaxis    Requires epipen  . Triptans Shortness Of Breath and Other (See Comments)    Will cause BP to drop, also will cause chest pains.  . Imdur (Isosorbide) Hypertension  . Chloroquine Itching    BP 142/84  Pulse 72  Temp 97.6  F (36.4 C) (Temporal)  Resp 16  Ht 5\' 8"  (1.727 m)  Wt 205 lb 4 oz (93.101 kg)  BMI 31.21 kg/m2  No results found.   Review of Systems  Constitutional: Negative for fever, chills and diaphoresis.  HENT: Negative for nosebleeds, sore throat, facial swelling, mouth sores, trouble swallowing and ear discharge.   Eyes: Negative for photophobia, discharge and visual disturbance.  Respiratory: Negative for choking, chest tightness, shortness of breath and stridor.   Cardiovascular: Negative for chest pain and palpitations.  Gastrointestinal: Negative for nausea, vomiting, abdominal pain, diarrhea, constipation, blood in stool, abdominal distention, anal bleeding and rectal pain.  Genitourinary: Negative for dysuria, urgency, difficulty urinating and testicular pain.  Musculoskeletal: Positive for myalgias. Negative for back pain, arthralgias and  gait problem.  Skin: Negative for color change, pallor, rash and wound.  Neurological: Positive for headaches. Negative for dizziness, tremors, syncope, facial asymmetry, speech difficulty, weakness, light-headedness and numbness.  Hematological: Negative for adenopathy. Does not bruise/bleed easily.  Psychiatric/Behavioral: Negative for hallucinations, confusion and agitation.       Objective:   Physical Exam  Constitutional: He is oriented to person, place, and time. He appears well-developed and well-nourished. No distress.  HENT:  Head: Normocephalic.  Mouth/Throat: Oropharynx is clear and moist. No oropharyngeal exudate.  Eyes: Conjunctivae and EOM are normal. Pupils are equal, round, and reactive to light. No scleral icterus.  Neck: Normal range of motion. Neck supple. No tracheal deviation present.  Cardiovascular: Normal rate, regular rhythm and intact distal pulses.   Pulmonary/Chest: Effort normal and breath sounds normal. No respiratory distress.  Abdominal: Soft. He exhibits no distension. There is no tenderness. Hernia confirmed negative in the right inguinal area and confirmed negative in the left inguinal area.       1cm reducible umbilical hernia  Musculoskeletal: Normal range of motion. He exhibits no tenderness.  Lymphadenopathy:    He has no cervical adenopathy.       Right: No inguinal adenopathy present.       Left: No inguinal adenopathy present.  Neurological: He is alert and oriented to person, place, and time. No cranial nerve deficit. He exhibits normal muscle tone. Coordination normal.  Skin: Skin is warm and dry. No rash noted. He is not diaphoretic. No erythema. No pallor.  Psychiatric: He has a normal mood and affect. His behavior is normal. Judgment and thought content normal.       Assessment:     Muscle weakness, need for muscle Bx  Small umb hernia - declines Sx/surgery    Plan:     The anatomy and physiology of muscular system as discussed.  Etiologies of muscle weakness and differential diagnosis including myositis was discussed. Options were discussed and recommendation was made for muscle biopsy. Given the need for a deeper incision, I strongly recommended general anesthesia for analgesia and allowing controlled hemostasis. I noted that it can take some time for pathology to result as it must be processed and sent to a national institution for bundling. Risks such as bleeding hematoma wound infection abscess pain and other risks were discussed in detail. Questions answered, and the patient agrees to proceed

## 2011-08-03 ENCOUNTER — Ambulatory Visit: Admit: 2011-08-03 | Payer: Self-pay | Admitting: Surgery

## 2011-08-03 SURGERY — MUSCLE BIOPSY
Anesthesia: General | Site: Thigh | Laterality: Left

## 2011-09-14 ENCOUNTER — Telehealth (INDEPENDENT_AMBULATORY_CARE_PROVIDER_SITE_OTHER): Payer: Self-pay | Admitting: General Surgery

## 2011-09-14 NOTE — Telephone Encounter (Signed)
Darel Hong, at Dr. Orlin Hilding' office, calling about referral for muscle bx.  Pt was seen in May by Dr. Michaell Cowing, eval for bx, and scheduled for bx on 08/03/11.  Pt subsequently cancelled the biopsy, saying he felt fine and was not having any weakness.  Dr. Lendon Colonel again referred him.  He has appt to be re-evaluated by the surgeon on 10/04/11.  I explained to her that his initial evaluation was well over 30 days ago, and it is policy of [facility] for there to be a re-evaluation.  As instructed by Darel Hong, this message was left on her voice mail.

## 2011-10-04 ENCOUNTER — Ambulatory Visit (INDEPENDENT_AMBULATORY_CARE_PROVIDER_SITE_OTHER): Payer: BC Managed Care – PPO | Admitting: Surgery

## 2011-10-04 ENCOUNTER — Encounter (INDEPENDENT_AMBULATORY_CARE_PROVIDER_SITE_OTHER): Payer: Self-pay | Admitting: Surgery

## 2011-10-04 VITALS — BP 142/80 | HR 83 | Temp 97.4°F | Ht 68.5 in | Wt 214.6 lb

## 2011-10-04 DIAGNOSIS — L910 Hypertrophic scar: Secondary | ICD-10-CM

## 2011-10-04 DIAGNOSIS — M6281 Muscle weakness (generalized): Secondary | ICD-10-CM

## 2011-10-04 DIAGNOSIS — M6282 Rhabdomyolysis: Secondary | ICD-10-CM

## 2011-10-04 NOTE — Progress Notes (Signed)
Subjective:     Patient ID: Dennis Garza, male   DOB: Nov 05, 1975, 36 y.o.   MRN: [id number]  HPI  Dennis Garza  12/25/1975 [id number]  Patient Care Team: [name], MD as PCP - General (Family Medicine) [name], MD as Consulting Physician (Rheumatology)  This patient is a 36 y.o.male who presents today for surgical evaluation at the request of Dr. [name].   Reason for evaluation: Intermittent muscle weakness.  Possible myositis.  Need for muscle biopsy.  Patient is a formerly athletic male who's had intermittent episodes of muscle weakness for the past few years.  He's underwent a rather thorough evaluation by Dr. Lendon Colonel.  She was concerned about the possibility of myositis.  Had some muscle pain and elevated CK blood levels.  She recommended muscle biopsy.  Initially he has been hesitant to do this.  I saw him in May.Muscle biopsy was set up and the patient canceled.  He now notes he's getting issues again and is more open to considering muscle biopsy.  He notes he tends to have large scars and was concerned about that as well.  Patient Active Problem List  Diagnosis  . Dehydration  . Rhabdomyolysis  . Hyponatremia  . Abnormal EKG  . Proximal muscle weakness, possible myositis  . Umbilical hernia, 1cm reducible  . Hypertrophic scarring    Past Medical History  Diagnosis Date  . Hypertension   . Migraine   . Hypercholesterolemia     Past Surgical History  Procedure Date  . Lung biopsy 2009    right  . Cardiac catherization 2010    History   Social History  . Marital Status: Married    Spouse Name: N/A    Number of Children: N/A  . Years of Education: N/A   Occupational History  . Not on file.   Social History Main Topics  . Smoking status: Never Smoker   . Smokeless tobacco: Never Used  . Alcohol Use: Yes     once a month  . Drug Use: No  . Sexually Active: No   Other Topics Concern  . Not on file   Social History Narrative  . No  narrative on file    Family History  Problem Relation Age of Onset  . Cancer Maternal Aunt     breast    Current Outpatient Prescriptions  Medication Sig Dispense Refill  . EPINEPHrine (EPIPEN JR) 0.15 MG/0.3ML injection Inject 0.15 mg into the muscle as needed.      Marland Kitchen HYDROcodone-acetaminophen (VICODIN) 5-500 MG per tablet Take 1 tablet by mouth as needed.      . Multiple Vitamins-Minerals (MULTIVITAMIN WITH MINERALS) tablet Take 1 tablet by mouth daily.      Marland Kitchen olmesartan-hydrochlorothiazide (BENICAR HCT) 40-25 MG per tablet Take 1 tablet by mouth daily. Restart in 1 week      . Omega-3 Fatty Acids (FISH OIL) 1000 MG CAPS Take by mouth daily.      . traMADol (ULTRAM) 50 MG tablet Take 50 mg by mouth as needed.      . verapamil (COVERA HS) 240 MG (CO) 24 hr tablet Take 240 mg by mouth at bedtime.         Allergies  Allergen Reactions  . Lisinopril Other (See Comments)    Hypotension  . Shellfish Allergy Anaphylaxis    Requires epipen  . Triptans Shortness Of Breath and Other (See Comments)    Will cause BP to drop, also will cause chest pains.  Marland Kitchen  Imdur (Isosorbide) Hypertension  . Chloroquine Itching    BP 142/80  Pulse 83  Temp 97.4 F (36.3 C) (Temporal)  Ht 5' 8.5" (1.74 m)  Wt 214 lb 9.6 oz (97.342 kg)  BMI 32.16 kg/m2  SpO2 98%  No results found.   Review of Systems  Constitutional: Negative for fever, chills and diaphoresis.  HENT: Negative for nosebleeds, sore throat, facial swelling, mouth sores, trouble swallowing and ear discharge.   Eyes: Negative for photophobia, discharge and visual disturbance.  Respiratory: Negative for choking, chest tightness, shortness of breath and stridor.   Cardiovascular: Negative for chest pain and palpitations.  Gastrointestinal: Negative for nausea, vomiting, abdominal pain, diarrhea, constipation, blood in stool, abdominal distention, anal bleeding and rectal pain.  Genitourinary: Negative for dysuria, urgency, difficulty  urinating and testicular pain.  Musculoskeletal: Positive for myalgias. Negative for back pain, arthralgias and gait problem.  Skin: Negative for color change, pallor, rash and wound.  Neurological: Positive for headaches. Negative for dizziness, tremors, syncope, facial asymmetry, speech difficulty, weakness, light-headedness and numbness.  Hematological: Negative for adenopathy. Does not bruise/bleed easily.  Psychiatric/Behavioral: Negative for hallucinations, confusion and agitation.       Objective:   Physical Exam  Constitutional: He is oriented to person, place, and time. He appears well-developed and well-nourished. No distress.  HENT:  Head: Normocephalic.  Mouth/Throat: Oropharynx is clear and moist. No oropharyngeal exudate.  Eyes: Conjunctivae and EOM are normal. Pupils are equal, round, and reactive to light. No scleral icterus.  Neck: Normal range of motion. Neck supple. No tracheal deviation present.  Cardiovascular: Normal rate, regular rhythm and intact distal pulses.   Pulmonary/Chest: Effort normal and breath sounds normal. No respiratory distress.  Abdominal: Soft. He exhibits no distension. There is no tenderness. Hernia confirmed negative in the right inguinal area and confirmed negative in the left inguinal area.       1cm reducible umbilical hernia  Musculoskeletal: Normal range of motion. He exhibits no tenderness.  Lymphadenopathy:    He has no cervical adenopathy.       Right: No inguinal adenopathy present.       Left: No inguinal adenopathy present.  Neurological: He is alert and oriented to person, place, and time. No cranial nerve deficit. He exhibits normal muscle tone. Coordination normal.  Skin: Skin is warm and dry. No rash noted. He is not diaphoretic. No erythema. No pallor.  Psychiatric: He has a normal mood and affect. His behavior is normal. Judgment and thought content normal.       Assessment:     Muscle weakness, need for muscle Bx       Plan:     Will perform muscle biopsy left quadriceps per Dr. Caryn Section request.  Maryclare Labrador try and do at proximal thigh for cosmetic purposes.  I did discuss the procedure with him:  The anatomy and physiology of muscular system as discussed. Etiologies of muscle weakness and differential diagnosis including myositis was discussed. Options were discussed and recommendation was made for muscle biopsy. Given the need for a deeper incision, I strongly recommended general anesthesia for analgesia and allowing controlled hemostasis. I noted that it can take some time for pathology to result as it must be processed and sent to a national institution for bundling. Risks such as bleeding hematoma wound infection abscess pain and other risks were discussed in detail. Questions answered, and the patient agrees to proceed  Some of his concerns related his propensity to have hypertrophic scarring.  He wondered  if he had keloids.  Did not get on his hand but on his arm he did have one area.  I did offer for him to discuss with Dr. Ballard Russell with radiation oncology who has developed a postop incisional radiation protocol with Dr. Shon Hough with plastic surgery that minimizes this risk for hypertrophic scarring.  However, the patient wishes to hold off.

## 2011-10-04 NOTE — Patient Instructions (Signed)
Muscle Biopsy Your caregiver has recommended that you have a muscle biopsy (tissue sample) to confirm or prove a suspected muscular problem. During the biopsy, a small piece of muscle tissue is removed. It is then examined under a microscope by a pathologist (a specialist in tissue examination). Chemical tests can be run if they are indicated. Biopsies are taken when your caregiver cannot be 100% certain of the diagnosis (learning what is wrong) by physical exam, X-rays, or other studies. LET YOUR CAREGIVER KNOW ABOUT:  Allergies.   Medications taken including herbs, eye drops, over the counter medications, and creams.   Use of steroids (by mouth or creams)   Previous problems with anesthetics or novocaine.   Possibility of pregnancy, if this applies.   History of blood clots (thrombophlebitis).   History of bleeding or blood problems.   Previous surgery.   Other health problems.  BEFORE THE PROCEDURE  You should be present 60 minutes prior to your procedure or as directed.  PROCEDURE  A biopsy is often performed as a same day surgery. This can be done in a hospital or clinic. Biopsies are often performed under local anesthesia. This is accomplished by injecting medicine that makes the area of biopsy numb. General anesthesia is usually required for very young children, this means they would be sleeping through the procedure. Muscle biopsies are generally performed by making an incision and removing a small piece of muscle.  AFTER THE PROCEDURE  You will be taken to the recovery area where a nurse will watch and check your progress. Once you are awake, stable, and taking fluids well, barring other problems you will be allowed to go home. If the procedure has been minor you may be allowed to go home immediately. Once home, an ice pack applied to your operative site may help with discomfort and keep swelling down.  You may resume normal diet and activities as directed.   If the muscle  biopsy was performed on an arm or leg, avoid vigorous activity until your surgeon says it is okay.   Change dressings as directed.   Only take over-the-counter or prescription medicines for pain, discomfort, or fever as directed by your caregiver.   Call for your results as instructed by your caregiver. Remember it is your responsibility to obtain the results of your biopsy and any other tests performed. Do not assume everything is fine if you do not hear from your caregiver.  SEEK MEDICAL CARE IF:   You have increased bleeding (more than a small spot) from biopsy sites.   You develop redness, swelling, or increasing pain in the biopsy sites.   There is pus coming from the wound.   You have an unexplained oral temperature over 102 F (38.9 C).   A foul smell is coming from the wound or dressing.  SEEK IMMEDIATE MEDICAL CARE IF:   You develop a rash.   You have difficulty breathing.   You have any allergic problems.  Document Released: 06/14/2000 Document Revised: 02/25/2011 Document Reviewed: 06/28/2008 [facility] Patient Information 2012 [place], [place].

## 2011-10-07 ENCOUNTER — Encounter (HOSPITAL_COMMUNITY): Payer: Self-pay | Admitting: Pharmacy Technician

## 2011-10-14 ENCOUNTER — Encounter (HOSPITAL_COMMUNITY)
Admission: RE | Admit: 2011-10-14 | Discharge: 2011-10-14 | Disposition: A | Discharge status: Home / Self Care | Payer: BC Managed Care – PPO | Source: Ambulatory Visit | Attending: Surgery | Admitting: Surgery

## 2011-10-14 ENCOUNTER — Encounter (HOSPITAL_COMMUNITY): Payer: Self-pay

## 2011-10-14 HISTORY — DX: Acute myocardial infarction, unspecified: I21.9

## 2011-10-14 LAB — CBC
HCT: 41.5 % (ref 39.0–52.0)
Hemoglobin: 14.1 g/dL (ref 13.0–17.0)
MCH: 27.8 pg (ref 26.0–34.0)
RBC: 5.08 MIL/uL (ref 4.22–5.81)

## 2011-10-14 LAB — BASIC METABOLIC PANEL
Chloride: 101 mEq/L (ref 96–112)
GFR calc Af Amer: >90 mL/min (ref >90)
GFR calc non Af Amer: >90 mL/min (ref >90)
Glucose, Bld: 109 mg/dL — ABNORMAL HIGH (ref 70–99)
Potassium: 4.1 mEq/L (ref 3.5–5.1)
Sodium: 139 mEq/L (ref 135–145)

## 2011-10-14 NOTE — Progress Notes (Signed)
Pts PCP is Dr Marice Potter, pt was seen in April 2013 , follow up to hospital stay.  Pts cardiologist is Dr Anne Fu.  I faxed a request to both Dr Azucena Cecil and Dr Anne Fu requesting office notes, labs, cardiac studies.

## 2011-10-14 NOTE — Pre-Procedure Instructions (Signed)
[address]  10/14/2011   Your procedure is scheduled on:  Thursday, August 1st.  Report to [facility] at 11:30 AM.  Call this number if you have problems the morning of surgery: [phone]   Remember:   Do not eat food or drink any liquid:After Midnight.   Take these medicines the morning of surgery with A SIP OF WATER: May take Hydrocodone- Acetaminophen ( Vicodin) or Tramadol (Ultram) if needed.  Do not take any Aspirin or NSAIDS or herbal medications.   Do not wear jewelry, make-up or nail polish.  Do not wear lotions, powders, or perfumes. You may wear deodorant.  Do not shave 48 hours prior to surgery. Men may shave face and neck.  Do not bring valuables to the hospital.  Contacts, dentures or bridgework may not be worn into surgery.  Leave suitcase in the car. After surgery it may be brought to your room.  For patients admitted to the hospital, checkout time is 11:00 AM the day of discharge.   Patients discharged the day of surgery will not be allowed to drive home.  Name and phone number of your driver:  ___________  Special Instructions: CHG Shower Use Special Wash: 1/2 bottle night before surgery and 1/2 bottle morning of surgery.   Please read over the following fact sheets that you were given: Pain Booklet, Coughing and Deep Breathing and Surgical Site Infection Prevention

## 2011-10-14 NOTE — Progress Notes (Signed)
Pt's PCP is Dr Georges Lynch, cardiologist is Dr. Vonzell Schlatter and rheumoidologist is Dr Cheryll Cockayne.  I requested information form each of this Drs.  Pt has a history of Prizmetal Angina .  Pt states that he hasn't taken mends in over a month. I will ask Revonda Standard to view his chart.

## 2011-10-15 NOTE — Consult Note (Signed)
Anesthesia Chart Review:  Patient is a 36 year old male scheduled for a muscle biopsy from left thigh on 10/21/11 by Dr. Michaell Cowing to further evaluate for muscle weakness, possible myositis.  History includes recent episode of rhadomyolysis, BMI 30, non-smoker, HTN, hypercholesterolemia, prior lung biopsy showing no malignancy, severe peridontal disease (sees a Dr. Mayford Knife), migraines, Prinzmetal angina. Dr. Lendon Colonel is his Rheumatologist.    Dr. Azucena Cecil is his PCP.  He was last seen on 06/21/11 for hospital follow-up for hospitalization for rhabdomyolysis from 06/14/11-06/16/11.  His discharge summary recommended repeating an echocardiogram due to abnormal EKG, although he did rule out for MI by enzymes during his admission.  Since then, he was evaluated by his Cardiologist Dr. Anne Fu [facility]) on 07/30/11.  He did not order an echo or repeat his EKG.  He did however recommend that patient undergo muscle biopsy.  He was asymptomatic at that time and was not having any current issues with vasospastic angina.  He is on verapamil.    His last EKG in Epic is from 06/15/11 and showed NSR, non-specific ST/T wave abnormality, somewhat diffuse ST elevation which had been noted on prior EKGs dating back to April 2012.  His inferior T wave abnormality appeared somewhat improved but has also been present on prior EKGs.  He has had a prior stress echo and a cardiac cath according to Dr. Anne Fu' notes.  His previous Cardiologist is listed as Dr. Frederich Cha in [place].  A stress echo done on 11/02/07 was "negative for inducible ischemia by EKG findings.  Echo report revealed mild concentric LVH with EF of 60-65%."  ED notes from April 2013 report that cardiac cath from 02/07/09 was reviewed from [facility] and showed no CAD.  Will see if staff can request a copy of this study on the day of surgery once he signs a release of information.    He has had a more recent echo on 02/27/10 that showed: Left ventricle: The cavity size was  normal. Wall thickness was increased in a pattern of mild LVH. Systolic function was vigorous. The estimated ejection fraction was in the range of 65% to 70%.  CXR from 06/24/11 [facility] Sprang [facility] at [facility]) showed no active disease.  Labs noted.    He has been recently evaluated by his Cardiologist who encouraged him to undergo muscle biopsy for diagnosis.  Anticipate he can proceed if no acute CV symptoms.  Shonna Chock, PA-C

## 2011-10-20 MED ORDER — DEXTROSE 5 % IV SOLN
3.0000 g | INTRAVENOUS | Status: AC
  Administered 2011-10-21: 3 g via INTRAVENOUS
  Filled 2011-10-20: qty 3000

## 2011-10-20 MED ORDER — CHLORHEXIDINE GLUCONATE 4 % EX LIQD: 1.0000 "application " | Freq: Once | CUTANEOUS | Status: DC

## 2011-10-21 ENCOUNTER — Encounter (HOSPITAL_COMMUNITY): Payer: Self-pay | Admitting: Vascular Surgery

## 2011-10-21 ENCOUNTER — Ambulatory Visit (HOSPITAL_COMMUNITY): Payer: BC Managed Care – PPO | Admitting: Vascular Surgery

## 2011-10-21 ENCOUNTER — Encounter (HOSPITAL_COMMUNITY)
Admission: RE | Disposition: A | Discharge status: Home / Self Care | Payer: Self-pay | Source: Ambulatory Visit | Attending: Surgery

## 2011-10-21 ENCOUNTER — Ambulatory Visit (HOSPITAL_COMMUNITY)
Admission: RE | Admit: 2011-10-21 | Discharge: 2011-10-21 | Disposition: A | Discharge status: Home / Self Care | Payer: BC Managed Care – PPO | Source: Ambulatory Visit | Attending: Surgery | Admitting: Surgery

## 2011-10-21 DIAGNOSIS — M6281 Muscle weakness (generalized): Secondary | ICD-10-CM

## 2011-10-21 DIAGNOSIS — I10 Essential (primary) hypertension: Secondary | ICD-10-CM | POA: Insufficient documentation

## 2011-10-21 DIAGNOSIS — R51 Headache: Secondary | ICD-10-CM | POA: Insufficient documentation

## 2011-10-21 HISTORY — PX: MUSCLE BIOPSY: SHX716

## 2011-10-21 SURGERY — MUSCLE BIOPSY
Anesthesia: General | Site: Thigh | Laterality: Left | Wound class: Clean

## 2011-10-21 MED ORDER — LACTATED RINGERS IV SOLN
INTRAVENOUS | Status: DC
  Administered 2011-10-21: 13:00:00 via INTRAVENOUS

## 2011-10-21 MED ORDER — TRAMADOL HCL 50 MG PO TABS: 50.0000 mg | ORAL_TABLET | Freq: Four times a day (QID) | ORAL | Status: AC | PRN

## 2011-10-21 MED ORDER — ACETAMINOPHEN 10 MG/ML IV SOLN: 1000.0000 mg | Freq: Once | INTRAVENOUS | Status: DC | PRN

## 2011-10-21 MED ORDER — ONDANSETRON HCL 4 MG/2ML IJ SOLN
INTRAMUSCULAR | Status: DC | PRN
  Administered 2011-10-21: 4 mg via INTRAVENOUS

## 2011-10-21 MED ORDER — ONDANSETRON HCL 4 MG/2ML IJ SOLN: 4.0000 mg | Freq: Once | INTRAMUSCULAR | Status: DC | PRN

## 2011-10-21 MED ORDER — LACTATED RINGERS IV SOLN
INTRAVENOUS | Status: DC | PRN
  Administered 2011-10-21: 13:00:00 via INTRAVENOUS

## 2011-10-21 MED ORDER — TRAMADOL HCL 50 MG PO TABS
50.0000 mg | ORAL_TABLET | ORAL | Status: DC
  Filled 2011-10-21: qty 1

## 2011-10-21 MED ORDER — SODIUM CHLORIDE 0.9 % IV SOLN: 250.0000 mL | INTRAVENOUS | Status: DC | PRN

## 2011-10-21 MED ORDER — LIDOCAINE HCL (CARDIAC) 20 MG/ML IV SOLN
INTRAVENOUS | Status: DC | PRN
  Administered 2011-10-21: 50 mg via INTRAVENOUS

## 2011-10-21 MED ORDER — OLMESARTAN MEDOXOMIL-HCTZ 40-25 MG PO TABS: 1.0000 | ORAL_TABLET | Freq: Every day | ORAL | Status: DC

## 2011-10-21 MED ORDER — ACETAMINOPHEN 650 MG RE SUPP: 650.0000 mg | RECTAL | Status: DC | PRN

## 2011-10-21 MED ORDER — TRAMADOL HCL 50 MG PO TABS: 50.0000 mg | ORAL_TABLET | Freq: Four times a day (QID) | ORAL | Status: DC | PRN

## 2011-10-21 MED ORDER — BUPIVACAINE-EPINEPHRINE 0.25% -1:200000 IJ SOLN
INTRAMUSCULAR | Status: DC | PRN
  Administered 2011-10-21: 1 mL

## 2011-10-21 MED ORDER — BUPIVACAINE-EPINEPHRINE PF 0.25-1:200000 % IJ SOLN
INTRAMUSCULAR | Status: AC
  Filled 2011-10-21: qty 30

## 2011-10-21 MED ORDER — FENTANYL CITRATE 0.05 MG/ML IJ SOLN
INTRAMUSCULAR | Status: DC | PRN
  Administered 2011-10-21: 100 ug via INTRAVENOUS
  Administered 2011-10-21: 50 ug via INTRAVENOUS

## 2011-10-21 MED ORDER — SODIUM CHLORIDE 0.9 % IJ SOLN: 3.0000 mL | Freq: Two times a day (BID) | INTRAMUSCULAR | Status: DC

## 2011-10-21 MED ORDER — ONDANSETRON HCL 4 MG/2ML IJ SOLN: 4.0000 mg | Freq: Four times a day (QID) | INTRAMUSCULAR | Status: DC | PRN

## 2011-10-21 MED ORDER — SODIUM CHLORIDE 0.9 % IJ SOLN: 3.0000 mL | INTRAMUSCULAR | Status: DC | PRN

## 2011-10-21 MED ORDER — TRAMADOL HCL 50 MG PO TABS
50.0000 mg | ORAL_TABLET | Freq: Four times a day (QID) | ORAL | Status: AC | PRN
  Administered 2011-10-21: 50 mg via ORAL

## 2011-10-21 MED ORDER — FENTANYL CITRATE 0.05 MG/ML IJ SOLN: 25.0000 ug | INTRAMUSCULAR | Status: DC | PRN

## 2011-10-21 MED ORDER — PROPOFOL 10 MG/ML IV EMUL
INTRAVENOUS | Status: DC | PRN
  Administered 2011-10-21: 300 mg via INTRAVENOUS

## 2011-10-21 MED ORDER — MIDAZOLAM HCL 5 MG/5ML IJ SOLN
INTRAMUSCULAR | Status: DC | PRN
  Administered 2011-10-21: 2 mg via INTRAVENOUS

## 2011-10-21 MED ORDER — HYDROMORPHONE HCL PF 1 MG/ML IJ SOLN: 0.2500 mg | INTRAMUSCULAR | Status: DC | PRN

## 2011-10-21 MED ORDER — 0.9 % SODIUM CHLORIDE (POUR BTL) OPTIME
TOPICAL | Status: DC | PRN
  Administered 2011-10-21: 1000 mL

## 2011-10-21 MED ORDER — ACETAMINOPHEN 325 MG PO TABS: 650.0000 mg | ORAL_TABLET | ORAL | Status: DC | PRN

## 2011-10-21 SURGICAL SUPPLY — 44 items
BLADE SURG 15 STRL LF DISP TIS (BLADE) ×1 IMPLANT
BLADE SURG 15 STRL SS (BLADE) ×1
CHLORAPREP W/TINT 10.5 ML (MISCELLANEOUS) ×2 IMPLANT
CLOTH BEACON ORANGE TIMEOUT ST (SAFETY) ×2 IMPLANT
CONT SPEC 4OZ CLIKSEAL STRL BL (MISCELLANEOUS) ×2 IMPLANT
COVER SURGICAL LIGHT HANDLE (MISCELLANEOUS) ×2 IMPLANT
DRAPE PED LAPAROTOMY (DRAPES) ×2 IMPLANT
DRAPE UTILITY 15X26 W/TAPE STR (DRAPE) ×4 IMPLANT
DRESSING TELFA 8X3 (GAUZE/BANDAGES/DRESSINGS) IMPLANT
DRSG TEGADERM 4X4.75 (GAUZE/BANDAGES/DRESSINGS) ×2 IMPLANT
ELECT CAUTERY BLADE 6.4 (BLADE) ×2 IMPLANT
ELECT REM PT RETURN 9FT ADLT (ELECTROSURGICAL) ×2
ELECTRODE REM PT RTRN 9FT ADLT (ELECTROSURGICAL) ×1 IMPLANT
GAUZE SPONGE 2X2 8PLY STRL LF (GAUZE/BANDAGES/DRESSINGS) ×1 IMPLANT
GAUZE SPONGE 4X4 16PLY XRAY LF (GAUZE/BANDAGES/DRESSINGS) ×2 IMPLANT
GLOVE BIO SURGEON STRL SZ7.5 (GLOVE) ×2 IMPLANT
GLOVE BIOGEL PI IND STRL 7.0 (GLOVE) ×1 IMPLANT
GLOVE BIOGEL PI IND STRL 8 (GLOVE) ×1 IMPLANT
GLOVE BIOGEL PI INDICATOR 7.0 (GLOVE) ×1
GLOVE BIOGEL PI INDICATOR 8 (GLOVE) ×1
GLOVE ECLIPSE 8.0 STRL XLNG CF (GLOVE) ×2 IMPLANT
GLOVE SURG SS PI 7.0 STRL IVOR (GLOVE) ×2 IMPLANT
GOWN PREVENTION PLUS XLARGE (GOWN DISPOSABLE) ×2 IMPLANT
GOWN STRL NON-REIN LRG LVL3 (GOWN DISPOSABLE) ×2 IMPLANT
KIT BASIN OR (CUSTOM PROCEDURE TRAY) ×2 IMPLANT
KIT ROOM TURNOVER OR (KITS) ×2 IMPLANT
NEEDLE 22X1 1/2 (OR ONLY) (NEEDLE) ×2 IMPLANT
NEEDLE HYPO 25GX1X1/2 BEV (NEEDLE) ×2 IMPLANT
NS IRRIG 1000ML POUR BTL (IV SOLUTION) ×2 IMPLANT
PACK SURGICAL SETUP 50X90 (CUSTOM PROCEDURE TRAY) ×2 IMPLANT
PAD ARMBOARD 7.5X6 YLW CONV (MISCELLANEOUS) ×4 IMPLANT
PENCIL BUTTON HOLSTER BLD 10FT (ELECTRODE) ×2 IMPLANT
SPONGE GAUZE 2X2 STER 10/PKG (GAUZE/BANDAGES/DRESSINGS) ×1
STRIP CLOSURE SKIN 1/2X4 (GAUZE/BANDAGES/DRESSINGS) ×2 IMPLANT
SUT MNCRL AB 4-0 PS2 18 (SUTURE) ×2 IMPLANT
SUT SILK 2 0 (SUTURE) ×1
SUT SILK 2-0 18XBRD TIE 12 (SUTURE) ×1 IMPLANT
SUT VIC AB 3-0 SH 18 (SUTURE) ×4 IMPLANT
SUT VIC AB 3-0 SH 27 (SUTURE) ×2
SUT VIC AB 3-0 SH 27X BRD (SUTURE) ×2 IMPLANT
SYR BULB 3OZ (MISCELLANEOUS) ×2 IMPLANT
SYR CONTROL 10ML LL (SYRINGE) ×2 IMPLANT
TOWEL OR 17X24 6PK STRL BLUE (TOWEL DISPOSABLE) IMPLANT
TOWEL OR 17X26 10 PK STRL BLUE (TOWEL DISPOSABLE) ×2 IMPLANT

## 2011-10-21 NOTE — Op Note (Signed)
10/21/2011  2:22 PM  PATIENT:  Dennis Garza  36 y.o. male  Patient Care Team: [name], MD as PCP - General (Family Medicine) [name], MD as Consulting Physician (Rheumatology)  PRE-OPERATIVE DIAGNOSIS:  MUSCLE WEAKNESS  POST-OPERATIVE DIAGNOSIS:  MUSCLE WEAKNESS  PROCEDURE:  Procedure(s): MUSCLE BIOPSY, Left quadriceps muscle  SURGEON:  Surgeon(s): [name], MD  ASSISTANT: none   ANESTHESIA:   local and general  EBL:     Delay start of Pharmacological VTE agent (>24hrs) due to surgical blood loss or risk of bleeding:  no  DRAINS: none   SPECIMEN:  Source of Specimen:  Left quadriceps muscle  DISPOSITION OF SPECIMEN:  PATHOLOGY  COUNTS:  YES  PLAN OF CARE: Discharge to home after PACU  PATIENT DISPOSITION:  PACU - hemodynamically stable.  INDICATION: Pleasant young male with chronic muscle weakness.  Worse on his thighs.  Has had an evaluation with elevated creatinine kinase concern for myositis.  Rheumatology recommended muscle biopsy.  Technique of muscle biopsy was discussed.  Risks benefits alternatives discussed.  Questions answered & he agreed to proceed  OR FINDINGS: Normal-appearing anatomy  DESCRIPTION: Informed consent was confirmed.  Patient underwent general C-spine difficulty.  ?IV antibiotics.  His left thigh was prepped and draped in sterile fashion.  Surgical tunnel confirmed or plan.  Because we can see A left-sided and per pathology's recommendations, and sent to biopsy the left side.  I made a centimeter later extended to 6 cm vertical incision on the anterolateral left thigh.  Went to the simultaneous tissues and encountered the fascia.  I opened that up with a scalpel.  Encountered an obvious a muscle bundle considered with the quadriceps muscle.  I bluntly free between the fibers and found a segment 3.5 x 1 cm x 1 cm.  After completely mobilizing 100 I transected across the muscle bundle sharply with a scalpel.  Then transected  the distal end.  This provided a good specimen.  I ensured hemostasis with cautery and the wound.  It was excellent.  I closed the quadriceps fascia using interrupted 3-0 Vicryl stitches.  I closed the wound using 3-0 Vicryl deep dermal stitches and 4 Monocryl running subcuticular stitch & streistrips.  Patient is extubated.  He is in recovery room in stable condition.  I discussed postop care with the patient in detail.  About discussed with family as well.  We already discussed with pathology to coordinate proper specimen processing.  The muscle biopsy specimen is being sent immediately

## 2011-10-21 NOTE — H&P (View-Only) (Signed)
Subjective:     Patient ID: Dennis Garza, male   DOB: 04/18/1975, 36 y.o.   MRN: [id number]  HPI  Dennis Garza  02/01/1976 [id number]  Patient Care Team: [name] [name], MD as PCP - General (Family Medicine) Angela D Hawkes, MD as Consulting Physician (Rheumatology)  This patient is a 36 y.o.male who presents today for surgical evaluation at the request of Dr. Hawkes.   Reason for evaluation: Intermittent muscle weakness.  Possible myositis.  Need for muscle biopsy.  Patient is a formerly athletic male who's had intermittent episodes of muscle weakness for the past few years.  He's underwent a rather thorough evaluation by Dr. Hawks.  She was concerned about the possibility of myositis.  Had some muscle pain and elevated CK blood levels.  She recommended muscle biopsy.  Initially he has been hesitant to do this.  I saw him in May.Muscle biopsy was set up and the patient canceled.  He now notes he's getting issues again and is more open to considering muscle biopsy.  He notes he tends to have large scars and was concerned about that as well.  Patient Active Problem List  Diagnosis  . Dehydration  . Rhabdomyolysis  . Hyponatremia  . Abnormal EKG  . Proximal muscle weakness, possible myositis  . Umbilical hernia, 1cm reducible  . Hypertrophic scarring    Past Medical History  Diagnosis Date  . Hypertension   . Migraine   . Hypercholesterolemia     Past Surgical History  Procedure Date  . Lung biopsy 2009    right  . Cardiac catherization 2010    History   Social History  . Marital Status: Married    Spouse Name: N/A    Number of Children: N/A  . Years of Education: N/A   Occupational History  . Not on file.   Social History Main Topics  . Smoking status: Never Smoker   . Smokeless tobacco: Never Used  . Alcohol Use: Yes     once a month  . Drug Use: No  . Sexually Active: No   Other Topics Concern  . Not on file   Social History Narrative  . No  narrative on file    Family History  Problem Relation Age of Onset  . Cancer Maternal Aunt     breast    Current Outpatient Prescriptions  Medication Sig Dispense Refill  . EPINEPHrine (EPIPEN JR) 0.15 MG/0.3ML injection Inject 0.15 mg into the muscle as needed.      . HYDROcodone-acetaminophen (VICODIN) 5-500 MG per tablet Take 1 tablet by mouth as needed.      . Multiple Vitamins-Minerals (MULTIVITAMIN WITH MINERALS) tablet Take 1 tablet by mouth daily.      . olmesartan-hydrochlorothiazide (BENICAR HCT) 40-25 MG per tablet Take 1 tablet by mouth daily. Restart in 1 week      . Omega-3 Fatty Acids (FISH OIL) 1000 MG CAPS Take by mouth daily.      . traMADol (ULTRAM) 50 MG tablet Take 50 mg by mouth as needed.      . verapamil (COVERA HS) 240 MG (CO) 24 hr tablet Take 240 mg by mouth at bedtime.         Allergies  Allergen Reactions  . Lisinopril Other (See Comments)    Hypotension  . Shellfish Allergy Anaphylaxis    Requires epipen  . Triptans Shortness Of Breath and Other (See Comments)    Will cause BP to drop, also will cause chest pains.  .   Imdur (Isosorbide) Hypertension  . Chloroquine Itching    BP 142/80  Pulse 83  Temp 97.4 F (36.3 C) (Temporal)  Ht 5' 8.5" (1.74 m)  Wt 214 lb 9.6 oz (97.342 kg)  BMI 32.16 kg/m2  SpO2 98%  No results found.   Review of Systems  Constitutional: Negative for fever, chills and diaphoresis.  HENT: Negative for nosebleeds, sore throat, facial swelling, mouth sores, trouble swallowing and ear discharge.   Eyes: Negative for photophobia, discharge and visual disturbance.  Respiratory: Negative for choking, chest tightness, shortness of breath and stridor.   Cardiovascular: Negative for chest pain and palpitations.  Gastrointestinal: Negative for nausea, vomiting, abdominal pain, diarrhea, constipation, blood in stool, abdominal distention, anal bleeding and rectal pain.  Genitourinary: Negative for dysuria, urgency, difficulty  urinating and testicular pain.  Musculoskeletal: Positive for myalgias. Negative for back pain, arthralgias and gait problem.  Skin: Negative for color change, pallor, rash and wound.  Neurological: Positive for headaches. Negative for dizziness, tremors, syncope, facial asymmetry, speech difficulty, weakness, light-headedness and numbness.  Hematological: Negative for adenopathy. Does not bruise/bleed easily.  Psychiatric/Behavioral: Negative for hallucinations, confusion and agitation.       Objective:   Physical Exam  Constitutional: He is oriented to person, place, and time. He appears well-developed and well-nourished. No distress.  HENT:  Head: Normocephalic.  Mouth/Throat: Oropharynx is clear and moist. No oropharyngeal exudate.  Eyes: Conjunctivae and EOM are normal. Pupils are equal, round, and reactive to light. No scleral icterus.  Neck: Normal range of motion. Neck supple. No tracheal deviation present.  Cardiovascular: Normal rate, regular rhythm and intact distal pulses.   Pulmonary/Chest: Effort normal and breath sounds normal. No respiratory distress.  Abdominal: Soft. He exhibits no distension. There is no tenderness. Hernia confirmed negative in the right inguinal area and confirmed negative in the left inguinal area.       1cm reducible umbilical hernia  Musculoskeletal: Normal range of motion. He exhibits no tenderness.  Lymphadenopathy:    He has no cervical adenopathy.       Right: No inguinal adenopathy present.       Left: No inguinal adenopathy present.  Neurological: He is alert and oriented to person, place, and time. No cranial nerve deficit. He exhibits normal muscle tone. Coordination normal.  Skin: Skin is warm and dry. No rash noted. He is not diaphoretic. No erythema. No pallor.  Psychiatric: He has a normal mood and affect. His behavior is normal. Judgment and thought content normal.       Assessment:     Muscle weakness, need for muscle Bx       Plan:     Will perform muscle biopsy left quadriceps per Dr. Fox request.  We'll try and do at proximal thigh for cosmetic purposes.  I did discuss the procedure with him:  The anatomy and physiology of muscular system as discussed. Etiologies of muscle weakness and differential diagnosis including myositis was discussed. Options were discussed and recommendation was made for muscle biopsy. Given the need for a deeper incision, I strongly recommended general anesthesia for analgesia and allowing controlled hemostasis. I noted that it can take some time for pathology to result as it must be processed and sent to a national institution for bundling. Risks such as bleeding hematoma wound infection abscess pain and other risks were discussed in detail. Questions answered, and the patient agrees to proceed  Some of his concerns related his propensity to have hypertrophic scarring.  He wondered   if he had keloids.  Did not get on his hand but on his arm he did have one area.  I did offer for him to discuss with Dr. Bob Murray with radiation oncology who has developed a postop incisional radiation protocol with Dr. Truesdale with plastic surgery that minimizes this risk for hypertrophic scarring.  However, the patient wishes to hold off.      

## 2011-10-21 NOTE — Anesthesia Procedure Notes (Signed)
Procedure Name: LMA Insertion Date/Time: 10/21/2011 1:40 PM Performed by: Julianne Rice K Patient Re-evaluated:Patient Re-evaluated prior to inductionOxygen Delivery Method: Circle system utilized Preoxygenation: Pre-oxygenation with 100% oxygen Intubation Type: IV induction Ventilation: Mask ventilation without difficulty LMA: LMA inserted LMA Size: 5.0 Placement Confirmation: positive ETCO2 and breath sounds checked- equal and bilateral Tube secured with: Tape (Dr. Noreene Larsson) Dental Injury: Teeth and Oropharynx as per pre-operative assessment

## 2011-10-21 NOTE — Discharge Instructions (Signed)
GENERAL SURGERY: POST OP INSTRUCTIONS  1. DIET: Follow a light bland diet the first 24 hours after arrival home, such as soup, liquids, crackers, etc.  Be sure to include lots of fluids daily.  Avoid fast food or heavy meals as your are more likely to get nauseated.   2. Take your usually prescribed home medications unless otherwise directed. 3. PAIN CONTROL: a. Pain is best controlled by a usual combination of three different methods TOGETHER: i. Ice/Heat ii. Over the counter pain medication iii. Prescription pain medication b. Most patients will experience some swelling and bruising around the incisions.  Ice packs or heating pads (30-60 minutes up to 6 times a day) will help. Use ice for the first few days to help decrease swelling and bruising, then switch to heat to help relax tight/sore spots and speed recovery.  Some people prefer to use ice alone, heat alone, alternating between ice & heat.  Experiment to what works for you.  Swelling and bruising can take several weeks to resolve.   c. It is helpful to take an over-the-counter pain medication regularly for the first few weeks.  Choose one of the following that works best for you: i. Naproxen (Aleve, etc)  Two 220mg tabs twice a day ii. Ibuprofen (Advil, etc) Three 200mg tabs four times a day (every meal & bedtime) iii. Acetaminophen (Tylenol, etc) 500-650mg four times a day (every meal & bedtime) d. A  prescription for pain medication (such as oxycodone, hydrocodone, etc) should be given to you upon discharge.  Take your pain medication as prescribed.  i. If you are having problems/concerns with the prescription medicine (does not control pain, nausea, vomiting, rash, itching, etc), please call us [phone] to see if we need to switch you to a different pain medicine that will work better for you and/or control your side effect better. ii. If you need a refill on your pain medication, please contact your pharmacy.  They will contact our  office to request authorization. Prescriptions will not be filled after 5 pm or on week-ends. 4. Avoid getting constipated.  Between the surgery and the pain medications, it is common to experience some constipation.  Increasing fluid intake and taking a fiber supplement (such as Metamucil, Citrucel, FiberCon, MiraLax, etc) 1-2 times a day regularly will usually help prevent this problem from occurring.  A mild laxative (prune juice, Milk of Magnesia, MiraLax, etc) should be taken according to package directions if there are no bowel movements after 48 hours.   5. Wash / shower every day.  You may shower over the dressings as they are waterproof.  Continue to shower over incision(s) after the dressing is off. 6. Remove your waterproof bandages 5 days after surgery.  You may leave the incision open to air.  You may have skin tapes (Steri Strips) covering the incision(s).  Leave them on until one week, then remove.  You may replace a dressing/Band-Aid to cover the incision for comfort if you wish.      7. ACTIVITIES as tolerated:   a. You may resume regular (light) daily activities beginning the next day-such as daily self-care, walking, climbing stairs-gradually increasing activities as tolerated.  If you can walk 30 minutes without difficulty, it is safe to try more intense activity such as jogging, treadmill, bicycling, low-impact aerobics, swimming, etc. b. Save the most intensive and strenuous activity for last such as sit-ups, heavy lifting, contact sports, etc  Refrain from any heavy lifting or straining until you   are off narcotics for pain control.   c. DO NOT PUSH THROUGH PAIN.  Let pain be your guide: If it hurts to do something, don't do it.  Pain is your body warning you to avoid that activity for another week until the pain goes down. d. You may drive when you are no longer taking prescription pain medication, you can comfortably wear a seatbelt, and you can safely maneuver your car and apply  brakes. e. You may have sexual intercourse when it is comfortable.  8. FOLLOW UP in our office a. Please call [facility] at [phone] to set up an appointment to see your surgeon in the office for a follow-up appointment approximately 2-3 weeks after your surgery. b. Make sure that you call for this appointment the day you arrive home to insure a convenient appointment time. 9. IF YOU HAVE DISABILITY OR FAMILY LEAVE FORMS, BRING THEM TO THE OFFICE FOR PROCESSING.  DO NOT GIVE THEM TO YOUR DOCTOR.   WHEN TO CALL US [phone]: 1. Poor pain control 2. Reactions / problems with new medications (rash/itching, nausea, etc)  3. Fever over 101.5 F (38.5 C) 4. Worsening swelling or bruising 5. Continued bleeding from incision. 6. Increased pain, redness, or drainage from the incision   The clinic staff is available to answer your questions during regular business hours (8:30am-5pm).  Please don't hesitate to call and ask to speak to one of our nurses for clinical concerns.   If you have a medical emergency, go to the nearest emergency room or call 911.  A surgeon from [facility] is always on call at the hospitals   [facility] [address], [address], [place], [place]  [address] ? MAIN: [phone] ? TOLL FREE: [phone] ?  FAX [phone] [email or web address]  

## 2011-10-21 NOTE — Anesthesia Preprocedure Evaluation (Addendum)
Anesthesia Evaluation  Patient identified by MRN, date of birth, ID band Patient awake    Reviewed: Allergy & Precautions, H&P , NPO status , Patient's Chart, lab work & pertinent test results  Airway Mallampati: II      Dental  (+) Teeth Intact and Dental Advisory Given   Pulmonary neg pulmonary ROS,  breath sounds clear to auscultation        Cardiovascular hypertension, Pt. on medications + Past MI Rhythm:Regular Rate:Normal     Neuro/Psych  Headaches,  Neuromuscular disease negative psych ROS   GI/Hepatic negative GI ROS, Neg liver ROS,   Endo/Other  negative endocrine ROS  Renal/GU negative Renal ROS  negative genitourinary   Musculoskeletal negative musculoskeletal ROS (+)   Abdominal   Peds  Hematology negative hematology ROS (+)   Anesthesia Other Findings   Reproductive/Obstetrics negative OB ROS                          Anesthesia Physical Anesthesia Plan  ASA: II  Anesthesia Plan: General   Post-op Pain Management:    Induction: Intravenous  Airway Management Planned: Oral ETT  Additional Equipment:   Intra-op Plan:   Post-operative Plan: Extubation in OR  Informed Consent: I have reviewed the patients History and Physical, chart, labs and discussed the procedure including the risks, benefits and alternatives for the proposed anesthesia with the patient or authorized representative who has indicated his/her understanding and acceptance.   Dental advisory given  Plan Discussed with: Anesthesiologist and Surgeon  Anesthesia Plan Comments:         Anesthesia Quick Evaluation

## 2011-10-21 NOTE — Preoperative (Signed)
Beta Blockers   Reason not to administer Beta Blockers:Not Applicable 

## 2011-10-21 NOTE — Anesthesia Postprocedure Evaluation (Signed)
  Anesthesia Post-op Note  Patient: Dennis Garza  Procedure(s) Performed: Procedure(s) (LRB): MUSCLE BIOPSY (Left)  Patient Location: PACU  Anesthesia Type: General  Level of Consciousness: awake, alert  and oriented  Airway and Oxygen Therapy: Patient Spontanous Breathing  Post-op Pain: mild  Post-op Assessment: Post-op Vital signs reviewed and Patient's Cardiovascular Status Stable  Post-op Vital Signs: stable  Complications: No apparent anesthesia complications

## 2011-10-21 NOTE — Transfer of Care (Signed)
Immediate Anesthesia Transfer of Care Note  Patient: Dennis Garza  Procedure(s) Performed: Procedure(s) (LRB): MUSCLE BIOPSY (Left)  Patient Location: PACU  Anesthesia Type: General  Level of Consciousness: awake, alert  and oriented  Airway & Oxygen Therapy: Patient Spontanous Breathing and Patient connected to nasal cannula oxygen  Post-op Assessment: Report given to PACU RN and Post -op Vital signs reviewed and stable  Post vital signs: Reviewed and stable  Complications: No apparent anesthesia complications

## 2011-10-21 NOTE — Interval H&P Note (Signed)
History and Physical Interval Note:  10/21/2011 1:02 PM  Dennis Garza  has presented today for surgery, with the diagnosis of muscel weakness  The various methods of treatment have been discussed with the patient and family. After consideration of risks, benefits and other options for treatment, the patient has consented to  Procedure(s) (LRB): MUSCLE BIOPSY (Left) as a surgical intervention .  The patient's history has been reviewed, patient examined, no change in status, stable for surgery.  I have reviewed the patient's chart and labs.  Questions were answered to the patient's satisfaction.     Dennis Garza C.

## 2011-10-21 NOTE — Progress Notes (Signed)
Care of pt assumed by MA Leveta Wahab RN 

## 2011-10-21 NOTE — Progress Notes (Signed)
Pt c/o lightheadedness and slight nausea.  No emesis after taking liquids.  Instructed pt to lie down at home and sip fluids.  If nausea does not subside to call MD

## 2011-10-22 ENCOUNTER — Encounter (HOSPITAL_COMMUNITY): Payer: Self-pay | Admitting: Surgery

## 2011-10-22 ENCOUNTER — Telehealth (INDEPENDENT_AMBULATORY_CARE_PROVIDER_SITE_OTHER): Payer: Self-pay | Admitting: General Surgery

## 2011-10-22 NOTE — Telephone Encounter (Signed)
Pt calling for pain med; stated he has been taking Tramadol, but it is not working very well right now.  Hydrocodone 5/325 mg, # 30, 1-2 po Q 4-6 H prn pain, no refill called to [facility]:  [phone].

## 2011-11-03 ENCOUNTER — Ambulatory Visit (INDEPENDENT_AMBULATORY_CARE_PROVIDER_SITE_OTHER): Payer: BC Managed Care – PPO | Admitting: Surgery

## 2011-11-03 ENCOUNTER — Encounter (INDEPENDENT_AMBULATORY_CARE_PROVIDER_SITE_OTHER): Payer: Self-pay | Admitting: Surgery

## 2011-11-03 VITALS — BP 144/104 | HR 94 | Temp 96.6°F | Ht 69.0 in | Wt 205.6 lb

## 2011-11-03 DIAGNOSIS — M6281 Muscle weakness (generalized): Secondary | ICD-10-CM

## 2011-11-03 NOTE — Progress Notes (Signed)
Subjective:     Patient ID: Dennis Garza, male   DOB: [birth year], 36 y.o.   MRN: [id number]  HPI  Dennis Garza  [birth year] [id number]  Patient Care Team: [name], MD as PCP - General (Family Medicine) [name], MD as Consulting Physician (Rheumatology)  This patient is a 36 y.o.male who presents today for surgical evaluation at the request of Dr. [name].   Reason for evaluation: Intermittent muscle weakness.  Possible myositis.  S/p muscle biopsy 10/21/2011:  FINAL DIAGNOSIS Diagnosis Muscle Biopsy ( Send Out], Left thigh - SKELETAL MUSCLE SAMPLE WITHOUT MYOFIBER NECROSIS, INTERNAL NUCLEI, SIGNIFICANT ATROPHY, RAGGED RED FIBERS, ABNORMALLY STORED MATERIAL OR INFLAMMATION AT THE LIGHT AND ELECTRON MICROSCOPIC LEVELS. - SEE COMMENT. Microscopic Comment For complete report, see [facility] report [id number]. Diagnosis Note Diagnostic evaluation conducted by Dennis Ren, MD; [facility]; [phone]; See outside report.  Patient is a formerly athletic male who's had intermittent episodes of muscle weakness for the past few years.  He's underwent a rather thorough evaluation by Dr. [name].  She was concerned about the possibility of myositis.  Had some muscle pain and elevated CK blood levels.  She recommended muscle biopsy.  Initially he has been hesitant to do this. However because of persistent symptoms, he agreed to proceed.  I did a muscle biopsy on him almost 2 weeks ago.  He notes soreness is gone down.  Some occasional cramping but otherwise back to pretty regular activities.  He comes today with his son.  Pain controlled and minimal now.  He is due to see his rheumatologist tomorrow  Patient Active Problem List  Diagnosis  . Dehydration  . Rhabdomyolysis  . Hyponatremia  . Abnormal EKG  . Proximal muscle weakness, possible myositis  . Umbilical hernia, 1cm reducible  . Hypertrophic scarring    Past Medical History  Diagnosis Date  . Hypertension   .  Migraine   . Hypercholesterolemia   . Myocardial infarction     "probably"  EKG always Abdnmormal"  Cath clean.    Past Surgical History  Procedure Date  . Lung biopsy 2009    right  . Cardiac catherization 2010  . [place] 2010    [place]  . Muscle biopsy 10/21/2011    Procedure: MUSCLE BIOPSY;  Surgeon: [name], MD;  Location: [facility] OR;  Service: General;  Laterality: Left;    History   Social History  . Marital Status: Married    Spouse Name: N/A    Number of Children: N/A  . Years of Education: N/A   Occupational History  . Not on file.   Social History Main Topics  . Smoking status: Never Smoker   . Smokeless tobacco: Never Used  . Alcohol Use: Yes     once a month  . Drug Use: No  . Sexually Active: No   Other Topics Concern  . Not on file   Social History Narrative  . No narrative on file    Family History  Problem Relation Age of Onset  . Cancer Maternal Aunt     breast    Current Outpatient Prescriptions  Medication Sig Dispense Refill  . EPINEPHrine (EPIPEN JR) 0.15 MG/0.3ML injection Inject 0.15 mg into the muscle as needed. For allergic reaction      . HYDROcodone-acetaminophen (VICODIN) 5-500 MG per tablet Take 1 tablet by mouth every 6 (six) hours as needed. For pain      . Multiple Vitamins-Minerals (MULTIVITAMIN WITH MINERALS) tablet Take 1 tablet  by mouth daily.      . nitroGLYCERIN (NITROSTAT) 0.4 MG SL tablet Place 0.4 mg under the tongue every 5 (five) minutes as needed.      Marland Kitchen olmesartan-hydrochlorothiazide (BENICAR HCT) 40-25 MG per tablet Take 1 tablet by mouth daily. Restart in 1 week      . traMADol (ULTRAM) 50 MG tablet Take 50 mg by mouth every 8 (eight) hours as needed. For pain      . verapamil (COVERA HS) 240 MG (CO) 24 hr tablet Take 240 mg by mouth at bedtime.         Allergies  Allergen Reactions  . Imdur (Isosorbide) Other (See Comments)    Hypotension  . Lisinopril Other (See Comments)     Hypotension  . Shellfish Allergy Anaphylaxis    Requires epipen  . Triptans Shortness Of Breath and Other (See Comments)    Will cause BP to drop, also will cause chest pains.  . Ibuprofen   . Chloroquine Itching    BP 144/104  Pulse 94  Temp 96.6 F (35.9 C) (Temporal)  Ht 5\' 9"  (1.753 m)  Wt 205 lb 9.6 oz (93.26 kg)  BMI 30.36 kg/m2  SpO2 97%  No results found.   Review of Systems  Constitutional: Negative for fever, chills and diaphoresis.  HENT: Negative for nosebleeds, sore throat, facial swelling, mouth sores, trouble swallowing and ear discharge.   Eyes: Negative for photophobia, discharge and visual disturbance.  Respiratory: Negative for choking, chest tightness, shortness of breath and stridor.   Cardiovascular: Negative for chest pain and palpitations.  Gastrointestinal: Negative for nausea, vomiting, abdominal pain, diarrhea, constipation, blood in stool, abdominal distention, anal bleeding and rectal pain.  Genitourinary: Negative for dysuria, urgency, difficulty urinating and testicular pain.  Musculoskeletal: Positive for myalgias. Negative for back pain, arthralgias and gait problem.  Skin: Negative for color change, pallor, rash and wound.  Neurological: Positive for headaches. Negative for dizziness, tremors, syncope, facial asymmetry, speech difficulty, weakness, light-headedness and numbness.  Hematological: Negative for adenopathy. Does not bruise/bleed easily.  Psychiatric/Behavioral: Negative for hallucinations, confusion and agitation.       Objective:   Physical Exam  Constitutional: He is oriented to person, place, and time. He appears well-developed and well-nourished. No distress.  HENT:  Head: Normocephalic.  Mouth/Throat: Oropharynx is clear and moist. No oropharyngeal exudate.  Eyes: Conjunctivae and EOM are normal. Pupils are equal, round, and reactive to light. No scleral icterus.  Neck: Normal range of motion. Neck supple. No tracheal  deviation present.  Cardiovascular: Normal rate, regular rhythm and intact distal pulses.   Pulmonary/Chest: Effort normal and breath sounds normal. No respiratory distress.  Abdominal: Soft. He exhibits no distension. There is no tenderness. Hernia confirmed negative in the right inguinal area and confirmed negative in the left inguinal area.       1cm reducible umbilical hernia  Musculoskeletal: Normal range of motion. He exhibits no tenderness.       Legs: Lymphadenopathy:    He has no cervical adenopathy.       Right: No inguinal adenopathy present.       Left: No inguinal adenopathy present.  Neurological: He is alert and oriented to person, place, and time. No cranial nerve deficit. He exhibits normal muscle tone. Coordination normal.  Skin: Skin is warm and dry. No rash noted. He is not diaphoretic. No erythema. No pallor.  Psychiatric: He has a normal mood and affect. His behavior is normal. Judgment and thought content normal.  Assessment:     Muscle weakness s/p muscle Bx.  Recovering well     Plan:     Increase activity as tolerated.  Do not push through pain.  I did give a copy of pathology report to him.  It does not sound like classic myositis.  However, I agree with him following up with Dr. [name], his rheumatologist, for discussion on where to go from here.  Return to clinic p.r.n. The patient expressed understanding and appreciation

## 2011-11-28 ENCOUNTER — Other Ambulatory Visit: Payer: Self-pay

## 2011-11-28 ENCOUNTER — Encounter (HOSPITAL_COMMUNITY): Payer: Self-pay | Admitting: *Deleted

## 2011-11-28 ENCOUNTER — Emergency Department (HOSPITAL_COMMUNITY): Payer: BC Managed Care – PPO

## 2011-11-28 ENCOUNTER — Emergency Department (HOSPITAL_COMMUNITY)
Admission: EM | Admit: 2011-11-28 | Discharge: 2011-11-28 | Disposition: A | Discharge status: Home / Self Care | Payer: BC Managed Care – PPO | Attending: Emergency Medicine | Admitting: Emergency Medicine

## 2011-11-28 DIAGNOSIS — I10 Essential (primary) hypertension: Secondary | ICD-10-CM | POA: Insufficient documentation

## 2011-11-28 DIAGNOSIS — R0789 Other chest pain: Secondary | ICD-10-CM

## 2011-11-28 DIAGNOSIS — R42 Dizziness and giddiness: Secondary | ICD-10-CM | POA: Insufficient documentation

## 2011-11-28 DIAGNOSIS — R079 Chest pain, unspecified: Secondary | ICD-10-CM | POA: Insufficient documentation

## 2011-11-28 DIAGNOSIS — R9431 Abnormal electrocardiogram [ECG] [EKG]: Secondary | ICD-10-CM

## 2011-11-28 DIAGNOSIS — I252 Old myocardial infarction: Secondary | ICD-10-CM | POA: Insufficient documentation

## 2011-11-28 DIAGNOSIS — E7800 Pure hypercholesterolemia, unspecified: Secondary | ICD-10-CM | POA: Insufficient documentation

## 2011-11-28 LAB — BASIC METABOLIC PANEL
BUN: 14 mg/dL (ref 6–23)
Calcium: 9.5 mg/dL (ref 8.4–10.5)
Creatinine, Ser: 1.02 mg/dL (ref 0.50–1.35)
GFR calc Af Amer: >90 mL/min (ref >90)
GFR calc non Af Amer: >90 mL/min (ref >90)

## 2011-11-28 LAB — CBC WITH DIFFERENTIAL/PLATELET
Basophils Relative: 0 % (ref 0–1)
Eosinophils Absolute: 0.1 10*3/uL (ref 0.0–0.7)
HCT: 40.6 % (ref 39.0–52.0)
Hemoglobin: 13.8 g/dL (ref 13.0–17.0)
MCH: 27.7 pg (ref 26.0–34.0)
MCHC: 34 g/dL (ref 30.0–36.0)
Monocytes Absolute: 0.5 10*3/uL (ref 0.1–1.0)
Monocytes Relative: 5 % (ref 3–12)
RDW: 13.5 % (ref 11.5–15.5)

## 2011-11-28 MED ORDER — METHOCARBAMOL 500 MG PO TABS: 500.0000 mg | ORAL_TABLET | Freq: Two times a day (BID) | ORAL | Status: AC

## 2011-11-28 MED ORDER — HYDROCODONE-ACETAMINOPHEN 5-325 MG PO TABS: 2.0000 | ORAL_TABLET | Freq: Four times a day (QID) | ORAL | Status: AC | PRN

## 2011-11-28 MED ORDER — KETOROLAC TROMETHAMINE 30 MG/ML IJ SOLN
30.0000 mg | Freq: Once | INTRAMUSCULAR | Status: AC
  Administered 2011-11-28: 30 mg via INTRAVENOUS
  Filled 2011-11-28: qty 1

## 2011-11-28 MED ORDER — MORPHINE SULFATE 4 MG/ML IJ SOLN
4.0000 mg | Freq: Once | INTRAMUSCULAR | Status: AC
  Administered 2011-11-28: 4 mg via INTRAVENOUS
  Filled 2011-11-28: qty 1

## 2011-11-28 NOTE — ED Notes (Signed)
Pt. Started with left sided chest paint that started one hour ago in church.  Pt. Has c/o pain with palpation and movement.  Pt. denies any injury.

## 2011-11-28 NOTE — ED Provider Notes (Signed)
History     CSN: [id number]  Arrival date & time 11/28/11  1414   First MD Initiated Contact with Patient 11/28/11 1508      Chief Complaint  Patient presents with  . Chest Pain    (Consider location/radiation/quality/duration/timing/severity/associated sxs/prior treatment) HPI Comments: 36 y/o male comes in with cc of chest pain. Pt was at church this afternoon, and around noon started having chest pain that is left sided, and descrbied as sharp pain worse with palpation of the area and no associated n/sob/diophoresis. The pain has been constant and is worse with any movement. He has hx of abnormal ekg and had a cath at the [facility] that was non obstructive. No cardiac risk factors noted. Pt has no hx of PE, DVT, and no risk factors for the same. He has hx of angina, and takes nitro. Typically his pain responds to nitro, and is not worse with palpation or movement like it is today.    Patient is a 36 y.o. male presenting with chest pain. The history is provided by the patient.  Chest Pain Primary symptoms include dizziness. Pertinent negatives for primary symptoms include no fever, no shortness of breath, no cough and no palpitations.     Past Medical History  Diagnosis Date  . Hypertension   . Migraine   . Hypercholesterolemia   . Myocardial infarction     "probably"  EKG always Abdnmormal"  Cath clean.    Past Surgical History  Procedure Date  . Lung biopsy 2009    right  . Cardiac catherization 2010  . Cardiac catheterization 2010    [place], [place]  . Muscle biopsy 10/21/2011    Procedure: MUSCLE BIOPSY;  Surgeon: [name], MD;  Location: [facility] OR;  Service: General;  Laterality: Left;    Family History  Problem Relation Age of Onset  . Cancer Maternal Aunt     breast    History  Substance Use Topics  . Smoking status: Never Smoker   . Smokeless tobacco: Never Used  . Alcohol Use: Yes     once a month      Review of Systems  Constitutional:  Negative for fever, chills and activity change.  HENT: Negative for neck pain.   Eyes: Negative for visual disturbance.  Respiratory: Negative for cough, chest tightness and shortness of breath.   Cardiovascular: Positive for chest pain. Negative for palpitations and leg swelling.  Gastrointestinal: Negative for abdominal distention.  Genitourinary: Negative for dysuria, enuresis and difficulty urinating.  Musculoskeletal: Negative for arthralgias.  Neurological: Positive for dizziness. Negative for light-headedness and headaches.  Psychiatric/Behavioral: Negative for confusion.    Allergies  Imdur; Lisinopril; Shellfish allergy; Triptans; Ibuprofen; and Chloroquine  Home Medications   Current Outpatient Rx  Name Route Sig Dispense Refill  . EPINEPHRINE 0.15 MG/0.3ML IJ DEVI Intramuscular Inject 0.15 mg into the muscle as needed. For allergic reaction    . HYDROCODONE-ACETAMINOPHEN 5-500 MG PO TABS Oral Take 1 tablet by mouth every 6 (six) hours as needed. For pain    . MULTI-VITAMIN/MINERALS PO TABS Oral Take 1 tablet by mouth daily.    Marland Kitchen NITROGLYCERIN 0.4 MG SL SUBL Sublingual Place 0.4 mg under the tongue every 5 (five) minutes as needed. For chest pain.    Marland Kitchen OLMESARTAN MEDOXOMIL-HCTZ 40-25 MG PO TABS Oral Take 1 tablet by mouth daily.     . TRAMADOL HCL 50 MG PO TABS Oral Take 50 mg by mouth every 8 (eight) hours as needed.  For pain    . VERAPAMIL HCL ER (CO) 240 MG PO TB24 Oral Take 240 mg by mouth at bedtime.      BP 134/91  Pulse 92  Temp 98.7 F (37.1 C) (Oral)  Resp 20  SpO2 100%  Physical Exam  Nursing note and vitals reviewed. Constitutional: He is oriented to person, place, and time. He appears well-developed.  HENT:  Head: Normocephalic and atraumatic.  Eyes: Conjunctivae and EOM are normal. Pupils are equal, round, and reactive to light.  Neck: Normal range of motion. Neck supple.  Cardiovascular: Normal rate and regular rhythm.   Pulmonary/Chest: Effort  normal and breath sounds normal. He exhibits tenderness.       Reproducible tenderness with palpation of the chest on the entire left side, and when patient tried to sit or move.  Abdominal: Soft. Bowel sounds are normal. He exhibits no distension. There is no tenderness. There is no rebound and no guarding.  Neurological: He is alert and oriented to person, place, and time.  Skin: Skin is warm.    ED Course  Procedures (including critical care time)   Labs Reviewed  CBC WITH DIFFERENTIAL  BASIC METABOLIC PANEL  TROPONIN I  TROPONIN I   Dg Chest Port 1 View  11/28/2011  *RADIOLOGY REPORT*  Clinical Data: Chest pain.  History of hypertension.  PORTABLE CHEST - 1 VIEW  Comparison: Chest x-ray 06/24/2011.  Findings: The patient is in a lordotic position.  There are some bibasilar opacities favored to represent areas of subsegmental atelectasis.  No definite consolidative airspace disease.  No pleural effusions.  Pulmonary vasculature is normal.  There appears to be some mild enlargement of the cardiopericardial silhouette, however, this is likely exaggerated by the lordotic positioning and slight rotation to the right. The patient is rotated to the right on today's exam, resulting in distortion of the mediastinal contours and reduced diagnostic sensitivity and specificity for mediastinal pathology.  IMPRESSION: 1.  Probable bibasilar subsegmental atelectasis. 2.  Although the cardiopericardial silhouette appears mildly enlarged, this is potentially artifactual secondary to lordotic positioning and rotation to the right.   Original Report Authenticated By: Florencia Reasons, M.D.      No diagnosis found.    MDM   Date: 11/28/2011  Rate: 98  Rhythm: normal sinus rhythm  QRS Axis: normal  Intervals: normal  ST/T Wave abnormalities: nonspecific ST/T changes  Conduction Disutrbances:none  Narrative Interpretation:   Old EKG Reviewed: unchanged T wave inversion in the inferior leads,  lateral leads.  Differential diagnosis includes: ACS syndrome Myocarditis Pericarditis Pneumonia Pleural effusion Pulmonary edema PE Musculoskeletal pain  Pt comes in with cc of chest pain, that is reproducible on hx and on exam. He has hx of abnml EKG with a negative cath - and the EKG today is unchanged. There is hx of elevated cpk, with a negative biopsy in the past. Given his hx, and presentation, and no risk factors for CAD - this appears to be musculoskeletal pain. Plan - trops x 2, 2 ekg, morphine for pain. Pt has already taken nitro himself, and EMS gave him nitro and asa with no relief.   Date: 11/28/2011  Rate: *93  Rhythm: normal sinus rhythm  QRS Axis: normal  Intervals: normal  ST/T Wave abnormalities: nonspecific T wave changes  Conduction Disutrbances:none  Narrative Interpretation:   Old EKG Reviewed: unchanged, T wave inversion in the inferior leads, lateral leads.           Derwood Kaplan, MD  11/28/11 1909 

## 2011-12-06 ENCOUNTER — Encounter (INDEPENDENT_AMBULATORY_CARE_PROVIDER_SITE_OTHER): Payer: Self-pay

## 2013-01-21 ENCOUNTER — Encounter: Payer: Self-pay | Admitting: Cardiology

## 2013-01-21 DIAGNOSIS — E7800 Pure hypercholesterolemia, unspecified: Secondary | ICD-10-CM | POA: Insufficient documentation

## 2013-01-21 DIAGNOSIS — J309 Allergic rhinitis, unspecified: Secondary | ICD-10-CM | POA: Insufficient documentation

## 2013-01-21 DIAGNOSIS — I10 Essential (primary) hypertension: Secondary | ICD-10-CM | POA: Insufficient documentation

## 2013-01-21 DIAGNOSIS — I219 Acute myocardial infarction, unspecified: Secondary | ICD-10-CM | POA: Insufficient documentation

## 2013-01-22 ENCOUNTER — Encounter: Payer: Self-pay | Admitting: Cardiology

## 2013-01-22 ENCOUNTER — Ambulatory Visit (INDEPENDENT_AMBULATORY_CARE_PROVIDER_SITE_OTHER): Payer: BC Managed Care – PPO | Admitting: Cardiology

## 2013-01-22 VITALS — BP 124/76 | HR 78 | Ht 69.0 in | Wt 215.0 lb

## 2013-01-22 DIAGNOSIS — I201 Angina pectoris with documented spasm: Secondary | ICD-10-CM

## 2013-01-22 DIAGNOSIS — I10 Essential (primary) hypertension: Secondary | ICD-10-CM

## 2013-01-22 MED ORDER — NITROGLYCERIN 0.4 MG SL SUBL: 0.4000 mg | SUBLINGUAL_TABLET | SUBLINGUAL | Status: DC | PRN

## 2013-01-22 NOTE — Patient Instructions (Signed)
Your physician wants you to follow-up in: 6 MONTHS WITH DR. Anne Fu You will receive a reminder letter in the mail two months in advance. If you don't receive a letter, please call our office to schedule the follow-up appointment.  Your physician recommends that you continue on your current medications as directed. Please refer to the Current Medication list given to you today.  WE FILLED YOUR NITRO TODAY

## 2013-01-22 NOTE — Progress Notes (Signed)
[address] [place], [place]  [address] Phone: [phone] Fax:  [phone]  Date:  01/22/2013   ID:  Dennis Garza, DOB [birth year], MRN [id number]  PCP:  [name], MD   History of Present Illness: Dennis Garza is a 37 y.o. male with difficult to control hypertension, prior Prinzmetal's angina, migraines here for followup.  Overall he is doing very well. Very pleased with his blood pressures. Recently had another child. Little girl. His boy [name] is 2.   Wt Readings from Last 3 Encounters:  01/22/13 215 lb (97.523 kg)  11/03/11 205 lb 9.6 oz (93.26 kg)  10/14/11 209 lb 9.6 oz (95.074 kg)     Past Medical History  Diagnosis Date  . Hypertension   . Migraine   . Hypercholesterolemia   . Myocardial infarction     "probably"  EKG always Abdnmormal"  Cath clean.  . Lung mass     ,Benign  . Allergic rhinitis   . Hypercholesterolemia     Past Surgical History  Procedure Laterality Date  . Lung biopsy  2009    right  . Cardiac catherization  2010  . [place]ization  2010    [place]  . Muscle biopsy  10/21/2011    Procedure: MUSCLE BIOPSY;  Surgeon: [name], MD;  Location: [facility] OR;  Service: General;  Laterality: Left;    Current Outpatient Prescriptions  Medication Sig Dispense Refill  . ACETAMINOPHEN-BUTALBITAL 50-325 MG TABS Take 1 tablet by mouth as needed. No more than twice a week.      . cyclobenzaprine (FLEXERIL) 10 MG tablet Take 10 mg by mouth 3 (three) times daily as needed for muscle spasms.      . divalproex (DEPAKOTE) 250 MG DR tablet Take 2 tablets by mouth 2 (two) times daily.      Marland Kitchen EPINEPHrine (EPIPEN JR) 0.15 MG/0.3ML injection Inject 0.15 mg into the muscle as needed. For allergic reaction      . HYDROcodone-acetaminophen (VICODIN) 5-500 MG per tablet Take 1 tablet by mouth every 6 (six) hours as needed. For pain      . ibuprofen (ADVIL,MOTRIN) 800 MG tablet Take 800 mg by mouth as needed for pain.      Marland Kitchen  ketoprofen (ORUDIS) 75 MG capsule Take 1 capsule by mouth as needed.      . metoprolol succinate (TOPROL-XL) 50 MG 24 hr tablet Take 50 mg by mouth daily. Take with or immediately following a meal.      . nitroGLYCERIN (NITROSTAT) 0.4 MG SL tablet Place 0.4 mg under the tongue every 5 (five) minutes as needed. For chest pain.      Marland Kitchen olmesartan-hydrochlorothiazide (BENICAR HCT) 40-25 MG per tablet Take 1 tablet by mouth daily.       . traMADol (ULTRAM) 50 MG tablet Take 50 mg by mouth every 8 (eight) hours as needed. For pain      . verapamil (COVERA HS) 240 MG (CO) 24 hr tablet Take 240 mg by mouth at bedtime.       No current facility-administered medications for this visit.    Allergies:    Allergies  Allergen Reactions  . Imdur [Isosorbide] Other (See Comments)    Hypotension  . Lisinopril Other (See Comments)    Hypotension  . Shellfish Allergy Anaphylaxis    Requires epipen  . Triptans Shortness Of Breath and Other (See Comments)    Will cause BP to drop, also will cause  chest pains.  . Ibuprofen   . Chloroquine Itching    Social History:  The patient  reports that he has never smoked. He has never used smokeless tobacco. He reports that he drinks alcohol. He reports that he does not use illicit drugs.   ROS:  Please see the history of present illness.   Denies any fevers, chills, orthopnea, PND    PHYSICAL EXAM: VS:  BP 124/76  Pulse 78  Ht 5\' 9"  (1.753 m)  Wt 215 lb (97.523 kg)  BMI 31.74 kg/m2 Well nourished, well developed, in no acute distress HEENT: normal Neck: no JVD Cardiac:  normal S1, S2; RRR; no murmur Lungs:  clear to auscultation bilaterally, no wheezing, rhonchi or rales Abd: soft, nontender, no hepatomegaly Ext: no edema Skin: warm and dry Neuro: no focal abnormalities noted  EKG:  Sinus rhythm, 80, T wave inversion in 3, aVF, slightly more prominent than prior EKG but still seen on prior EKG, likely LVH with repolarization abnormality.      ASSESSMENT AND PLAN:  1. Hypertension-difficult to control but has been doing very well recently. He is off of Aldactone. Excellent. 2. Prinzmetal's angina-very well controlled. We will refill nitroglycerin. 3. EKG-T waves noted on EKG, likely repolarization variant. These were seen on prior EKG.  Signed, Donato Schultz, MD [facility]  01/22/2013 3:00 PM

## 2013-01-25 ENCOUNTER — Other Ambulatory Visit: Payer: Self-pay

## 2013-02-22 ENCOUNTER — Other Ambulatory Visit: Payer: Self-pay

## 2013-02-22 MED ORDER — METOPROLOL SUCCINATE ER 50 MG PO TB24: 50.0000 mg | ORAL_TABLET | Freq: Every day | ORAL | Status: DC

## 2013-07-27 ENCOUNTER — Ambulatory Visit: Payer: BC Managed Care – PPO | Admitting: Cardiology

## 2013-08-17 ENCOUNTER — Encounter: Payer: Self-pay | Admitting: Cardiology

## 2013-08-17 ENCOUNTER — Ambulatory Visit (INDEPENDENT_AMBULATORY_CARE_PROVIDER_SITE_OTHER): Payer: BC Managed Care – PPO | Admitting: Cardiology

## 2013-08-17 VITALS — BP 140/104 | HR 80 | Ht 69.0 in | Wt 208.0 lb

## 2013-08-17 DIAGNOSIS — R9431 Abnormal electrocardiogram [ECG] [EKG]: Secondary | ICD-10-CM

## 2013-08-17 DIAGNOSIS — I201 Angina pectoris with documented spasm: Secondary | ICD-10-CM

## 2013-08-17 DIAGNOSIS — I10 Essential (primary) hypertension: Secondary | ICD-10-CM

## 2013-08-17 MED ORDER — SPIRONOLACTONE 25 MG PO TABS: ORAL_TABLET | ORAL | Status: DC

## 2013-08-17 NOTE — Patient Instructions (Signed)
Start Aldactone (Spironoalactone) 25 mg take 1/2 tablet daily for 1 week after a week if blood pressure still elevated take 1 whole tablet daily    Your physician recommends that you schedule a follow-up appointment in: 1 month

## 2013-08-17 NOTE — Progress Notes (Signed)
[address] [place], [place]  [address] Phone: [phone]) [phone] Fax:  [phone]  Date:  08/17/2013   ID:  Dennis Garza, DOB December 06, 1975, MRN [id number]  PCP:  Dennis HoffSWAYNE,[name], MD   History of Present Illness: Dennis Garza is a 38 y.o. male with difficult to control hypertension, prior Prinzmetal's angina, migraines here for followup.  Recently hasn't been having elevated blood pressures. No significant symptoms with this. No chest pain, no shortness of breath. He is compliant with his medications. In the past, we had stopped his Aldactone.  Recently had another child. Little girl. His boy [name] is 2.   Wt Readings from Last 3 Encounters:  08/17/13 208 lb (94.348 kg)  01/22/13 215 lb (97.523 kg)  11/03/11 205 lb 9.6 oz (93.26 kg)     Past Medical History  Diagnosis Date  . Hypertension   . Migraine   . Hypercholesterolemia   . Myocardial infarction     "probably"  EKG always Abdnmormal"  Cath clean.  . Lung mass     ,Benign  . Allergic rhinitis   . Hypercholesterolemia     Past Surgical History  Procedure Laterality Date  . Lung biopsy  2009    right  . Cardiac catherization  2010  . [place]  . Muscle biopsy  10/21/2011    Procedure: MUSCLE BIOPSY;  Surgeon: Ardeth SportsmanSteven C. Gross, MD;  Location: [facility] OR;  Service: General;  Laterality: Left;    Current Outpatient Prescriptions  Medication Sig Dispense Refill  . ACETAMINOPHEN-BUTALBITAL 50-325 MG TABS Take 1 tablet by mouth as needed. No more than twice a week.      . cyclobenzaprine (FLEXERIL) 10 MG tablet Take 10 mg by mouth 3 (three) times daily as needed for muscle spasms.      . divalproex (DEPAKOTE) 250 MG DR tablet Take 2 tablets by mouth 2 (two) times daily.      Marland Kitchen. EPINEPHrine (EPIPEN JR) 0.15 MG/0.3ML injection Inject 0.15 mg into the muscle as needed. For allergic reaction      . HYDROcodone-acetaminophen (VICODIN) 5-500 MG per tablet Take 1 tablet by mouth  every 6 (six) hours as needed. For pain      . ibuprofen (ADVIL,MOTRIN) 800 MG tablet Take 800 mg by mouth as needed for pain.      Marland Kitchen. ketoprofen (ORUDIS) 75 MG capsule Take 1 capsule by mouth as needed.      . metoprolol succinate (TOPROL-XL) 50 MG 24 hr tablet Take 1 tablet (50 mg total) by mouth daily. Take with or immediately following a meal.  30 tablet  6  . nitroGLYCERIN (NITROSTAT) 0.4 MG SL tablet Place 1 tablet (0.4 mg total) under the tongue every 5 (five) minutes as needed. For chest pain.  25 tablet  3  . olmesartan-hydrochlorothiazide (BENICAR HCT) 40-25 MG per tablet Take 1 tablet by mouth daily.       . traMADol (ULTRAM) 50 MG tablet Take 50 mg by mouth every 8 (eight) hours as needed. For pain      . verapamil (COVERA HS) 240 MG (CO) 24 hr tablet Take 240 mg by mouth at bedtime.       No current facility-administered medications for this visit.    Allergies:    Allergies  Allergen Reactions  . Imdur [Isosorbide] Other (See Comments)    Hypotension  . Lisinopril Other (See Comments)    Hypotension  . Shellfish Allergy  Anaphylaxis    Requires epipen  . Triptans Shortness Of Breath and Other (See Comments)    Will cause BP to drop, also will cause chest pains.  . Ibuprofen   . Chloroquine Itching    Social History:  The patient  reports that he has never smoked. He has never used smokeless tobacco. He reports that he drinks alcohol. He reports that he does not use illicit drugs.   ROS:  Please see the history of present illness.   Denies any fevers, chills, orthopnea, PND    PHYSICAL EXAM: VS:  BP 140/104  Pulse 80  Ht 5\' 9"  (1.753 m)  Wt 208 lb (94.348 kg)  BMI 30.70 kg/m2 Well nourished, well developed, in no acute distress HEENT: normal Neck: no JVD Cardiac:  normal S1, S2; RRR; no murmur Lungs:  clear to auscultation bilaterally, no wheezing, rhonchi or rales Abd: soft, nontender, no hepatomegaly Ext: no edema Skin: warm and dry Neuro: no focal  abnormalities noted  EKG:  Sinus rhythm, 80, T wave inversion in 3, aVF, slightly more prominent than prior EKG but still seen on prior EKG, likely LVH with repolarization abnormality.     ASSESSMENT AND PLAN:  1. Hypertension-difficult to control. Recently been high at home. Previously, we had stopped his Aldactone. He seemed to be well without it for some time however now he is having elevated/uncontrolled blood pressure. I've instructed him to restart Aldactone taking 12.5 mg over the next week and if uncontrolled, go up to 25 mg. He will see me back in approximately one month. Previously his lab work was normal on this medication.   2. Prinzmetal's angina-very well controlled. Verapamil and nitroglycerin. 3. EKG-T waves noted on EKG, likely repolarization variant. These were seen on prior EKG.  Signed, Donato Schultz, MD [facility]  08/17/2013 1:57 PM

## 2013-09-11 ENCOUNTER — Telehealth: Payer: Self-pay | Admitting: Cardiology

## 2013-09-11 NOTE — Telephone Encounter (Signed)
Pt states he went to [facility] in [place] yesterday evening because he was having chest pain. Pt was observed in the ED  discharged at 2:20 AM this morning. Pt states chest pain had subsided and troponin X 2  were "OK". He was allowed to leave the ED. Pt states his chest pain returned a short time ago and he has used 2 NTG without relief of his chest pain. [place] is still in [place] . I advised pt to return to ED for further evaluation.

## 2013-09-11 NOTE — Telephone Encounter (Signed)
New message  Pt called states that he [place] currently in [place] and is experiencing chest pains// Pt says that he is going to the hospital however he requests a call back to discuss.

## 2013-09-12 ENCOUNTER — Other Ambulatory Visit: Payer: Self-pay | Admitting: *Deleted

## 2013-09-12 MED ORDER — OLMESARTAN MEDOXOMIL-HCTZ 40-25 MG PO TABS: 1.0000 | ORAL_TABLET | Freq: Every day | ORAL | Status: DC

## 2013-09-15 ENCOUNTER — Other Ambulatory Visit: Payer: Self-pay | Admitting: Cardiology

## 2013-09-24 ENCOUNTER — Encounter: Payer: BC Managed Care – PPO | Admitting: Physician Assistant

## 2013-10-23 ENCOUNTER — Encounter: Payer: Self-pay | Admitting: *Deleted

## 2013-10-23 ENCOUNTER — Ambulatory Visit (INDEPENDENT_AMBULATORY_CARE_PROVIDER_SITE_OTHER): Payer: BC Managed Care – PPO | Admitting: Cardiology

## 2013-10-23 ENCOUNTER — Encounter: Payer: Self-pay | Admitting: Cardiology

## 2013-10-23 VITALS — BP 144/98 | HR 71 | Ht 68.0 in | Wt 211.0 lb

## 2013-10-23 DIAGNOSIS — I201 Angina pectoris with documented spasm: Secondary | ICD-10-CM

## 2013-10-23 DIAGNOSIS — R9431 Abnormal electrocardiogram [ECG] [EKG]: Secondary | ICD-10-CM

## 2013-10-23 DIAGNOSIS — I10 Essential (primary) hypertension: Secondary | ICD-10-CM

## 2013-10-23 MED ORDER — CARVEDILOL 12.5 MG PO TABS: 12.5000 mg | ORAL_TABLET | Freq: Two times a day (BID) | ORAL | Status: DC

## 2013-10-23 NOTE — Progress Notes (Signed)
[address] [place], [place]  [address] Phone: [phone]([phone] Fax:  [phone]([phone]  Date:  10/23/2013   ID:  Dennis DecantBenedict Gelin, DOB [birth year], MRN [id number]  PCP:  Sissy HoffSWAYNE,[name], MD   History of Present Illness: Dennis Garza is a 38 y.o. male with difficult to control hypertension, prior Prinzmetal's angina, migraines here for followup.  [place] on 09/10/13 with chest pain in the emergency department and discharged early in the morning. Troponins were normal. Nitroglycerin did not relieve discomfort. He is evaluated by cardiology. No dynamic EKG changes. Does not tolerate him Emeline DarlingGore as this worsens his headaches. [facility] in 2010 [facility]. These episodes tend to occur with stress. Pain was described as sharp, stabbing, 7/10 then down to 3/10. Left chest under nipple no radiation. Underwent cardiac stress test, exercised for 9 minutes and 30 seconds blood pressure was hypertensive response. Exam was terminated due to chest. Inferior T wave inversion was seen with exercise, nonspecific. Normal stress echocardiogram portion. Renal duplex was recommended. Creatinine 1.07.  Child. Little girl. His boy Toby    Wt Readings from Last 3 Encounters:  10/23/13 211 lb (95.709 kg)  08/17/13 208 lb (94.348 kg)  01/22/13 215 lb (97.523 kg)     Past Medical History  Diagnosis Date  . Hypertension   . Migraine   . Hypercholesterolemia   . Myocardial infarction     "probably"  EKG always Abdnmormal"  Cath clean.  . Lung mass     ,Benign  . Allergic rhinitis   . Hypercholesterolemia     Past Surgical History  Procedure Laterality Date  . Lung biopsy  2009    right  . Cardiac catherization  2010  . [place]  . Muscle biopsy  10/21/2011    Procedure: MUSCLE BIOPSY;  Surgeon: Ardeth SportsmanSteven C. Gross, MD;  Location: [facility] OR;  Service: General;  Laterality: Left;    Current Outpatient  Prescriptions  Medication Sig Dispense Refill  . ACETAMINOPHEN-BUTALBITAL 50-325 MG TABS Take 1 tablet by mouth as needed. No more than twice a week.      . cyclobenzaprine (FLEXERIL) 10 MG tablet Take 10 mg by mouth 3 (three) times daily as needed for muscle spasms.      . divalproex (DEPAKOTE) 250 MG DR tablet Take 2 tablets by mouth 2 (two) times daily.      Marland Kitchen. EPINEPHrine (EPIPEN JR) 0.15 MG/0.3ML injection Inject 0.15 mg into the muscle as needed. For allergic reaction      . HYDROcodone-acetaminophen (VICODIN) 5-500 MG per tablet Take 1 tablet by mouth every 6 (six) hours as needed. For pain      . ibuprofen (ADVIL,MOTRIN) 800 MG tablet Take 800 mg by mouth as needed for pain.      . metoprolol succinate (TOPROL-XL) 50 MG 24 hr tablet Take 1 tablet (50 mg total) by mouth daily. Take with or immediately following a meal.  30 tablet  6  . nitroGLYCERIN (NITROSTAT) 0.4 MG SL tablet Place 1 tablet (0.4 mg total) under the tongue every 5 (five) minutes as needed. For chest pain.  25 tablet  3  . olmesartan-hydrochlorothiazide (BENICAR HCT) 40-25 MG per tablet Take 1 tablet by mouth daily.  90 tablet  0  . spironolactone (ALDACTONE) 25 MG tablet Take 1/2 tablet daily for 1 week.After 1 week if blood pressure still elevated take 1 tablet daily.  30 tablet  6  . traMADol (ULTRAM) 50 MG tablet Take 50 mg by mouth every 8 (eight) hours as needed. For pain      . verapamil (CALAN-SR) 240 MG CR tablet TAKE ONE TABLET BY MOUTH ONE TIME DAILY  30 tablet  0  . verapamil (COVERA HS) 240 MG (CO) 24 hr tablet Take 240 mg by mouth at bedtime.       No current facility-administered medications for this visit.    Allergies:    Allergies  Allergen Reactions  . Imdur [Isosorbide] Other (See Comments)    Hypotension  . Lisinopril Other (See Comments)    Hypotension  . Shellfish Allergy Anaphylaxis    Requires epipen  . Triptans Shortness Of Breath and Other (See Comments)    Will cause BP to drop, also  will cause chest pains.  . Ibuprofen   . Chloroquine Itching    Social History:  The patient  reports that he has never smoked. He has never used smokeless tobacco. He reports that he drinks alcohol. He reports that he does not use illicit drugs.   ROS:  Please see the history of present illness.   Denies any fevers, chills, orthopnea, PND    PHYSICAL EXAM: VS:  BP 144/98  Pulse 71  Ht 5\' 8"  (1.727 m)  Wt 211 lb (95.709 kg)  BMI 32.09 kg/m2 Well nourished, well developed, in no acute distress HEENT: normal Neck: no JVD Cardiac:  normal S1, S2; RRR; no murmur Lungs:  clear to auscultation bilaterally, no wheezing, rhonchi or rales Abd: soft, nontender, no hepatomegaly Ext: no edema Skin: warm and dry Neuro: no focal abnormalities noted  EKG:  Sinus rhythm, 80, T wave inversion in 3, aVF, slightly more prominent than prior EKG but still seen on prior EKG, likely LVH with repolarization abnormality.    Lab work, medical records, stress test reviewed from [place].  ASSESSMENT AND PLAN:  1. Hypertension-difficult to control. Multidrug regimen. I will check renal ultrasound duplex. He is tolerating spironolactone well. Blood work recently checked in [place] reassuring. He checked his blood pressure at home earlier this week and it was normal. Mildly elevated today. I'm going to change him from metoprolol over to carvedilol. Perhaps alpha blocker will help. Recently been high at home. Previously, we had stopped his Aldactone. He seemed to be well without it for some time however now he is having elevated/uncontrolled blood pressure. I've instructed him to restart Aldactone taking 12.5 mg over the next week and if uncontrolled, go up to 25 mg. He will see me back in approximately one month. Previously his lab work was normal on this medication.   2. Prinzmetal's angina-adding carvedilol.. Verapamil and nitroglycerin. Does not tolerate Imdur well. Headaches. Stress echocardiogram 6/15-normal.  He asked me if he thought he needed a catheterization and I do not believe that he needs this currently. 3. EKG-T waves noted on EKG, likely repolarization variant. These were seen on prior EKG.  Signed, Donato Schultz, MD [facility]  10/23/2013 9:23 AM

## 2013-10-23 NOTE — Patient Instructions (Signed)
Please stop your Metoprolol. Start Carvedilol 12.5 mg onetablet twice a day. Continue all other medications as listed.  Your physician has requested that you have a renal artery duplex. During this test, an ultrasound is used to evaluate blood flow to the kidneys. Allow one hour for this exam. Do not eat after midnight the day before and avoid carbonated beverages. Take your medications as you usually do.  Follow up with Dr Anne FuSkains in 2 months.

## 2013-10-31 ENCOUNTER — Other Ambulatory Visit: Payer: Self-pay

## 2013-10-31 MED ORDER — VERAPAMIL HCL 240 MG (CO) PO TB24: 240.0000 mg | ORAL_TABLET | Freq: Every day | ORAL | Status: DC

## 2013-11-02 ENCOUNTER — Ambulatory Visit (HOSPITAL_COMMUNITY): Payer: BC Managed Care – PPO | Attending: Cardiovascular Disease | Admitting: Cardiology

## 2013-11-02 DIAGNOSIS — I10 Essential (primary) hypertension: Secondary | ICD-10-CM | POA: Diagnosis present

## 2013-11-02 NOTE — Progress Notes (Signed)
Renal artery duplex 

## 2013-11-29 IMAGING — CR DG CHEST 1V PORT
1 series · 1 of 1 positions shown · non-contrast
Comparison: Chest x-ray 06/24/2011.

CLINICAL DATA: Chest pain.  History of hypertension.

PORTABLE CHEST - 1 VIEW

[AP]
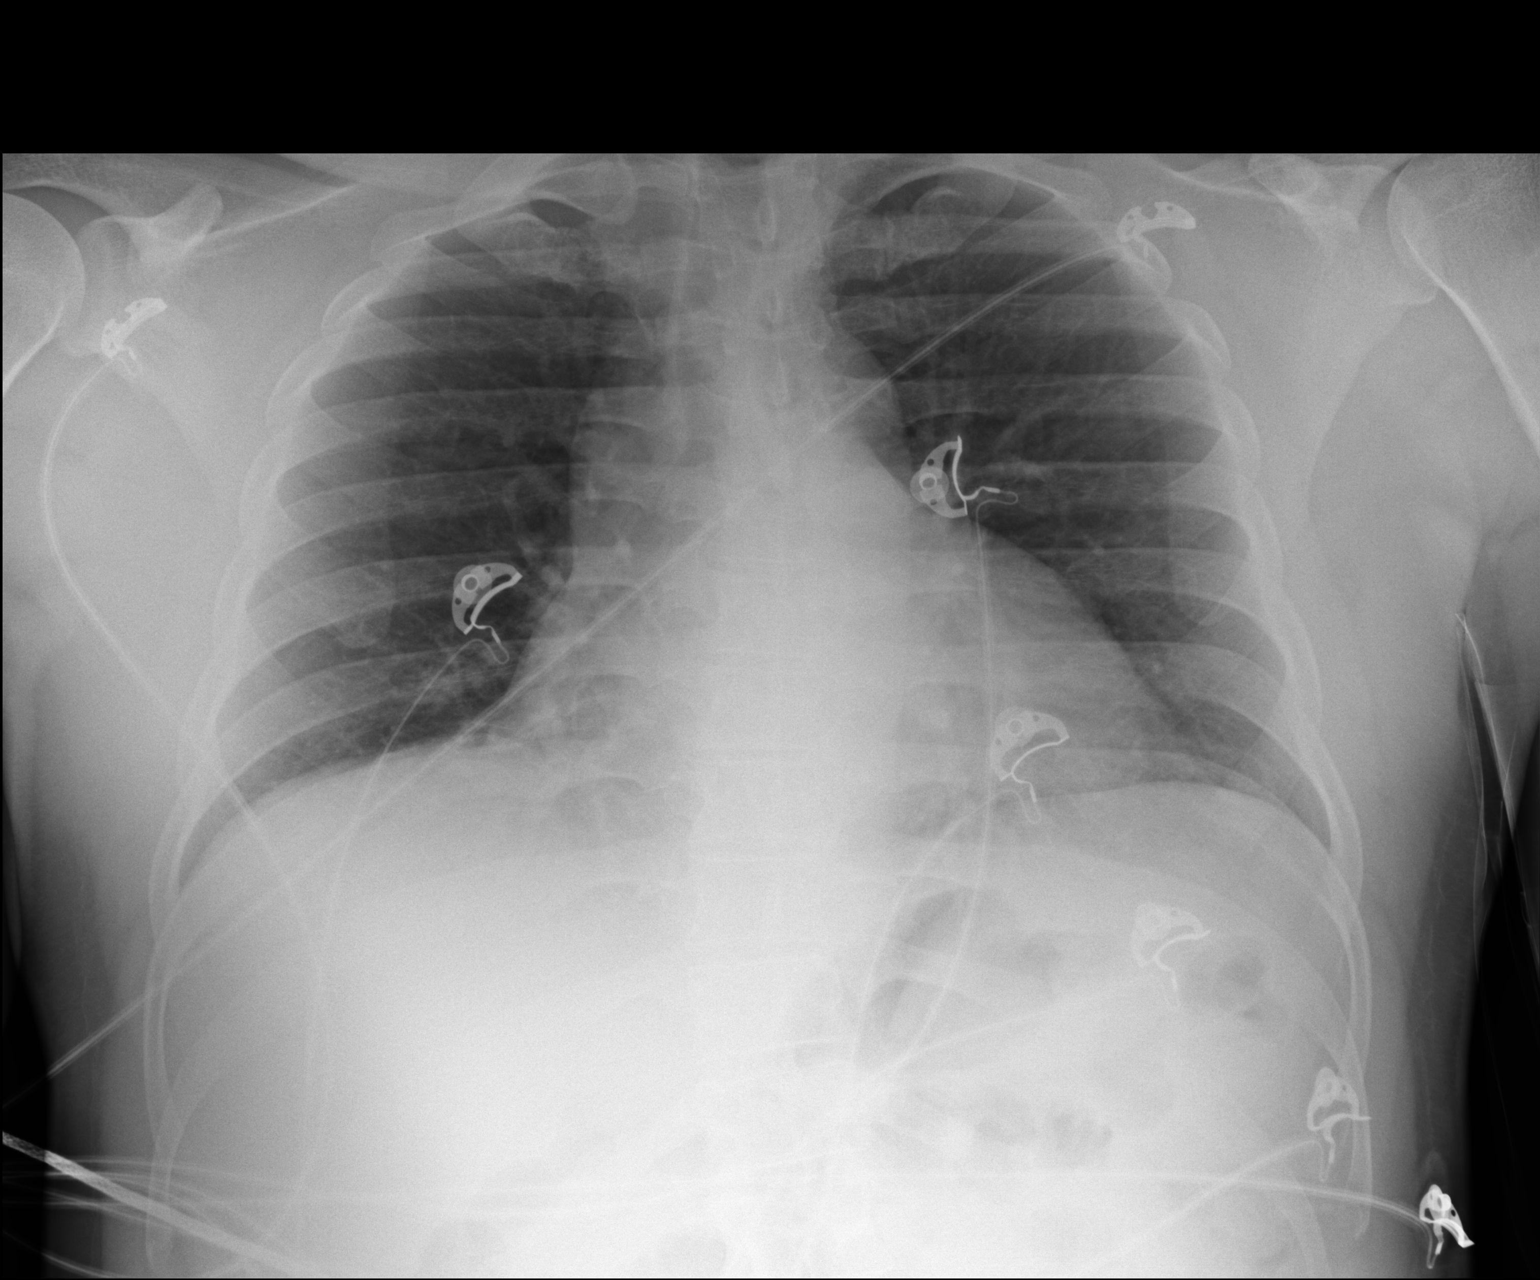

[1 of 1 positions shown; findings below may reference images not displayed]

FINDINGS: The patient is in a lordotic position.  There are some
bibasilar opacities favored to represent areas of subsegmental
atelectasis.  No definite consolidative airspace disease.  No
pleural effusions.  Pulmonary vasculature is normal.  There appears
to be some mild enlargement of the cardiopericardial silhouette,
however, this is likely exaggerated by the lordotic positioning and
slight rotation to the right. The patient is rotated to the right
on today's exam, resulting in distortion of the mediastinal
contours and reduced diagnostic sensitivity and specificity for
mediastinal pathology.
IMPRESSION: 1.  Probable bibasilar subsegmental atelectasis.
2.  Although the cardiopericardial silhouette appears mildly
enlarged, this is potentially artifactual secondary to lordotic
positioning and rotation to the right.

## 2013-12-24 ENCOUNTER — Ambulatory Visit (INDEPENDENT_AMBULATORY_CARE_PROVIDER_SITE_OTHER): Payer: BC Managed Care – PPO | Admitting: Cardiology

## 2013-12-24 ENCOUNTER — Encounter: Payer: Self-pay | Admitting: Cardiology

## 2013-12-24 VITALS — BP 122/90 | HR 78 | Ht 68.0 in | Wt 220.0 lb

## 2013-12-24 DIAGNOSIS — I10 Essential (primary) hypertension: Secondary | ICD-10-CM

## 2013-12-24 DIAGNOSIS — I201 Angina pectoris with documented spasm: Secondary | ICD-10-CM

## 2013-12-24 DIAGNOSIS — R9431 Abnormal electrocardiogram [ECG] [EKG]: Secondary | ICD-10-CM

## 2013-12-24 LAB — BASIC METABOLIC PANEL
BUN: 14 mg/dL (ref 6–23)
CHLORIDE: 101 meq/L (ref 96–112)
CO2: 27 meq/L (ref 19–32)
Calcium: 9.3 mg/dL (ref 8.4–10.5)
Creatinine, Ser: 0.9 mg/dL (ref 0.4–1.5)
GFR: 119.46 mL/min (ref >60.00)
GLUCOSE: 127 mg/dL — AB (ref 70–99)
Potassium: 4 mEq/L (ref 3.5–5.1)
Sodium: 136 mEq/L (ref 135–145)

## 2013-12-24 NOTE — Progress Notes (Signed)
[address] [place], [place]  [address] Phone: [phone]) [phone] Fax:  [phone]  Date:  12/24/2013   ID:  Dennis Garza, DOB 08-09-1975, MRN [id number]  PCP:  Sissy HoffSWAYNE,[name], MD   History of Present Illness: Dennis Garza is a 38 y.o. male with difficult to control hypertension, prior Prinzmetal's angina, migraines here for followup.  [place] was [place] in [place] on 09/10/13 with chest pain in the emergency department and discharged early in the morning. Troponins were normal. Nitroglycerin did not relieve discomfort. He is evaluated by cardiology. No dynamic EKG changes. Does not tolerate him Emeline DarlingGore as this worsens his headaches. Heart [facility] in 2010 [facility]. These episodes tend to occur with stress. Pain was described as sharp, stabbing, 7/10 then down to 3/10. Left chest under nipple no radiation. Underwent cardiac stress test, exercised for 9 minutes and 30 seconds blood pressure was hypertensive response. Exam was terminated due to chest. Inferior T wave inversion was seen with exercise, nonspecific. Normal stress echocardiogram portion. Renal duplex was recommended. Creatinine 1.07.  Child. Little girl. His boy Toby   Changed to coreg. Doing better. No CP, no SOB. Felt some mild dizziness this morning. Drank some water but this helped. At home high 120-130/85-90. Weight is up. Back pain.   Wt Readings from Last 3 Encounters:  12/24/13 220 lb (99.791 kg)  10/23/13 211 lb (95.709 kg)  08/17/13 208 lb (94.348 kg)     Past Medical History  Diagnosis Date  . Hypertension   . Migraine   . Hypercholesterolemia   . Myocardial infarction     "probably"  EKG always Abdnmormal"  Cath clean.  . Lung mass     ,Benign  . Allergic rhinitis   . Hypercholesterolemia     Past Surgical History  Procedure Laterality Date  . Lung biopsy  2009    right  . Cardiac catherization  2010  . [place]   . Muscle biopsy  10/21/2011    Procedure: MUSCLE BIOPSY;  Surgeon: Ardeth SportsmanSteven C. Gross, MD;  Location: [facility] OR;  Service: General;  Laterality: Left;    Current Outpatient Prescriptions  Medication Sig Dispense Refill  . ACETAMINOPHEN-BUTALBITAL 50-325 MG TABS Take 1 tablet by mouth as needed. No more than twice a week.      . carvedilol (COREG) 12.5 MG tablet Take 1 tablet (12.5 mg total) by mouth 2 (two) times daily.  60 tablet  11  . cyclobenzaprine (FLEXERIL) 10 MG tablet Take 10 mg by mouth 3 (three) times daily as needed for muscle spasms.      . divalproex (DEPAKOTE) 250 MG DR tablet Take 2 tablets by mouth 2 (two) times daily.      Marland Kitchen. EPINEPHrine (EPIPEN JR) 0.15 MG/0.3ML injection Inject 0.15 mg into the muscle as needed. For allergic reaction      . HYDROcodone-acetaminophen (VICODIN) 5-500 MG per tablet Take 1 tablet by mouth every 6 (six) hours as needed. For pain      . ibuprofen (ADVIL,MOTRIN) 800 MG tablet Take 800 mg by mouth as needed for pain.      . nitroGLYCERIN (NITROSTAT) 0.4 MG SL tablet Place 1 tablet (0.4 mg total) under the tongue every 5 (five) minutes as needed. For chest pain.  25 tablet  3  . olmesartan-hydrochlorothiazide (BENICAR HCT) 40-25 MG per tablet Take 1 tablet by mouth daily.  90 tablet  0  .  spironolactone (ALDACTONE) 25 MG tablet Take 1/2 tablet daily for 1 week.After 1 week if blood pressure still elevated take 1 tablet daily.  30 tablet  6  . traMADol (ULTRAM) 50 MG tablet Take 50 mg by mouth every 8 (eight) hours as needed. For pain      . verapamil (COVERA HS) 240 MG (CO) 24 hr tablet Take 1 tablet (240 mg total) by mouth at bedtime.  90 tablet  1   No current facility-administered medications for this visit.    Allergies:    Allergies  Allergen Reactions  . Imdur [Isosorbide] Other (See Comments)    Hypotension  . Lisinopril Other (See Comments)    Hypotension  . Shellfish Allergy Anaphylaxis    Requires epipen  . Triptans Shortness Of Breath and  Other (See Comments)    Will cause BP to drop, also will cause chest pains.  . Ibuprofen   . Chloroquine Itching    Social History:  The patient  reports that he has never smoked. He has never used smokeless tobacco. He reports that he drinks alcohol. He reports that he does not use illicit drugs.   ROS:  Please see the history of present illness.   Denies any fevers, chills, orthopnea, PND    PHYSICAL EXAM: VS:  BP 122/90  Pulse 78  Ht 5\' 8"  (1.727 m)  Wt 220 lb (99.791 kg)  BMI 33.46 kg/m2 Well nourished, well developed, in no acute distress HEENT: normal Neck: no JVD Cardiac:  normal S1, S2; RRR; no murmur Lungs:  clear to auscultation bilaterally, no wheezing, rhonchi or rales Abd: soft, nontender, no hepatomegaly Ext: no edema Skin: warm and dry Neuro: no focal abnormalities noted  EKG:  12/24/13-sinus rhythm, 78, nonspecific T-wave changes, likely secondary to hypertrophy with T wave inversion in 3, aVF.  Sinus rhythm, 80, T wave inversion in 3, aVF, slightly more prominent than prior EKG but still seen on prior EKG, likely LVH with repolarization abnormality.    Renal duplex 11/03/13-reassuring, no renal artery stenosis Lab work, medical records, stress test reviewed from [place].  ASSESSMENT AND PLAN:  1. Hypertension-difficult to control but improved. Multidrug regimen. Renal ultrasound duplex reassuring. He is tolerating spironolactone well. . Metoprolol over to carvedilol. Doing better. Mild dizziness at times. Blood pressure still remains under control. Not low.  Check BMET.  2. Prinzmetal's angina-adding carvedilol.. Verapamil and nitroglycerin, carvedilol. Does not tolerate Imdur well. Headaches. Stress echocardiogram 6/15-normal. He asked me previously if he thought he needed a catheterization and I do not believe that he needs this currently. 3. EKG-T waves noted on EKG, likely repolarization variant. These were seen on prior EKG. 4. 4 month f/u.  Signed, Donato Schultz, MD [facility]  12/24/2013 10:17 AM

## 2013-12-24 NOTE — Patient Instructions (Addendum)
The current medical regimen is effective;  continue present plan and medications.  Please have blood work today. (BMP)  Follow up in 4 months with Dr. Anne FuSkains.  You will receive a letter in the mail 2 months before you are due.  Please call us when you receive this letter to schedule your follow up appointment.

## 2013-12-27 ENCOUNTER — Ambulatory Visit: Payer: BC Managed Care – PPO | Admitting: Cardiology

## 2014-01-24 ENCOUNTER — Other Ambulatory Visit: Payer: Self-pay

## 2014-01-24 MED ORDER — OLMESARTAN MEDOXOMIL-HCTZ 40-25 MG PO TABS: 1.0000 | ORAL_TABLET | Freq: Every day | ORAL | Status: DC

## 2014-04-17 ENCOUNTER — Other Ambulatory Visit (INDEPENDENT_AMBULATORY_CARE_PROVIDER_SITE_OTHER): Payer: Self-pay | Admitting: Surgery

## 2014-04-17 NOTE — H&P (Signed)
Emidio Brabec 04/17/2014 10:51 AM Location: [facility] Patient #: [id number] DOB: [birth year] Married / Language: English / Race: Black or African American Male History of Present Illness [name] MD; 04/17/2014 2:37 PM) Patient words: New Pt / Evaluation for Hernia.  The patient is a 39 year old male who presents with an umbilical hernia. Patient sent for surgical consultation by Dr. Elias Else for concern of worsening umbilical hernia. Pleasant young male. Originally from [place]. Lived in the [place] for 13 years. Rather active. His ovaries had swelling on his belly button since he was born. Has not changed much. However has had episodes of sharp pains with it. Increasing in frequency. Maybe slightly larger. He eats okay. Has BM most days. Can walk several miles without difficulty. No history of prior abdominal surgery. No difficulty with urination. No fevers or chills. Some episodes of Prinzmetal angina in distant past but not severe. Normally rather active. Other Problems Victorino Dike Clay, RMA; 04/17/2014 10:51 AM) Back Pain High blood pressure Migraine Headache Umbilical Hernia Repair  Past Surgical History Victorino Dike [place], RMA; 04/17/2014 10:51 AM) No pertinent past surgical history  Diagnostic Studies History Victorino Dike [place], RMA; 04/17/2014 10:51 AM) Colonoscopy never  Allergies Victorino Dike [place], RMA; 04/17/2014 10:52 AM) Imdur *ANTIANGINAL AGENTS* Lisinopril *ANTIHYPERTENSIVES* SHELLFISH Triptans Chloroquine HCl *ANTIMALARIALS*  Medication History Victorino Dike Barry, RMA; 04/17/2014 10:56 AM) Medications Reconciled Depakote ER (  Tablet ER 24HR, 2 Oral twice') Active. Benicar HCT (40-25MG  Tablet, Oral daily) Active. Carvedilol (12.5MG  Tablet, 2 Oral daily) Active. Verapamil HCl ER (  Tablet ER, Oral daily) Active. Acetaminophen-Butalbital (325-50MG  Capsule, Oral as needed) Active. Spironolactone (  Tablet, Oral  daily) Active. Nabumetone (  Tablet, 2 Oral daily) Active. Flexeril (  Tablet, Oral three times daily) Active. Flurbiprofen (  Tablet, Oral bid prn) Active.  Social History Victorino Dike [place], [place]; 04/17/2014 10:51 AM) Alcohol use Occasional alcohol use. Caffeine use Carbonated beverages, Tea. No drug use Tobacco use Never smoker.  Family History Victorino Dike [place], [place]; 04/17/2014 10:51 AM) First Degree Relatives No pertinent family history     Review of Systems Victorino Dike Witty RMA; 04/17/2014 10:51 AM) General Not Present- Appetite Loss, Chills, Fatigue, Fever, Night Sweats, Weight Gain and Weight Loss. Skin Not Present- Change in Wart/Mole, Dryness, Hives, Jaundice, New Lesions, Non-Healing Wounds, Rash and Ulcer. HEENT Not Present- Earache, Hearing Loss, Hoarseness, Nose Bleed, Oral Ulcers, Ringing in the Ears, Seasonal Allergies, Sinus Pain, Sore Throat, Visual Disturbances, Wears glasses/contact lenses and Yellow Eyes. Respiratory Not Present- Bloody sputum, Chronic Cough, Difficulty Breathing, Snoring and Wheezing. Breast Not Present- Breast Mass, Breast Pain, Nipple Discharge and Skin Changes. Cardiovascular Not Present- Chest Pain, Difficulty Breathing Lying Down, Leg Cramps, Palpitations, Rapid Heart Rate, Shortness of Breath and Swelling of Extremities. Gastrointestinal Not Present- Abdominal Pain, Bloating, Bloody Stool, Change in Bowel Habits, Chronic diarrhea, Constipation, Difficulty Swallowing, Excessive gas, Gets full quickly at meals, Hemorrhoids, Indigestion, Nausea, Rectal Pain and Vomiting. Male Genitourinary Not Present- Blood in Urine, Change in Urinary Stream, Frequency, Impotence, Nocturia, Painful Urination, Urgency and Urine Leakage. Musculoskeletal Present- Back Pain. Not Present- Joint Pain, Joint Stiffness, Muscle Pain, Muscle Weakness and Swelling of Extremities. Neurological Not Present- Decreased Memory, Fainting, Headaches, Numbness, Seizures,  Tingling, Tremor, Trouble walking and Weakness. Psychiatric Not Present- Anxiety, Bipolar, Change in Sleep Pattern, Depression, Fearful and Frequent crying. Endocrine Not Present- Cold Intolerance, Excessive Hunger, Hair Changes, Heat Intolerance, Hot flashes and New Diabetes. Hematology Not Present- Easy Bruising, Excessive bleeding, Gland problems, HIV and Persistent Infections.  Vitals Victorino Dike Witty RMA; 04/17/2014  10:56 AM) 04/17/2014 10:56 AM Weight: 216.2 lb Height: 68.5in Body Surface Area: 2.18 m Body Mass Index: 32.39 kg/m Temp.: 98.56F(Oral)  Pulse: 72 (Regular)  Resp.: 14 (Unlabored)  BP: 122/84 (Sitting, Left Arm, Standard)     Physical Exam [name] MD; 04/17/2014 11:21 AM)  General Mental Status-Alert. General Appearance-Not in acute distress, Not Sickly. Orientation-Oriented X3. Hydration-Well hydrated. Voice-Normal.  Integumentary Global Assessment Upon inspection and palpation of skin surfaces of the - Axillae: non-tender, no inflammation or ulceration, no drainage. and Distribution of scalp and body hair is normal. General Characteristics Temperature - normal warmth is noted.  Head and Neck Head-normocephalic, atraumatic with no lesions or palpable masses. Face Global Assessment - atraumatic, no absence of expression. Neck Global Assessment - no abnormal movements, no bruit auscultated on the right, no bruit auscultated on the left, no decreased range of motion, non-tender. Trachea-midline. Thyroid Gland Characteristics - non-tender.  Eye Eyeball - Left-Extraocular movements intact, No Nystagmus. Eyeball - Right-Extraocular movements intact, No Nystagmus. Cornea - Left-No Hazy. Cornea - Right-No Hazy. Sclera/Conjunctiva - Left-No scleral icterus, No Discharge. Sclera/Conjunctiva - Right-No scleral icterus, No Discharge. Pupil - Left-Direct reaction to light normal. Pupil - Right-Direct reaction to  light normal.  ENMT Ears Pinna - Left - no drainage observed, no generalized tenderness observed. Right - no drainage observed, no generalized tenderness observed. Nose and Sinuses External Inspection of the Nose - no destructive lesion observed. Inspection of the nares - Left - quiet respiration. Right - quiet respiration. Mouth and Throat Lips - Upper Lip - no fissures observed, no pallor noted. Lower Lip - no fissures observed, no pallor noted. Nasopharynx - no discharge present. Oral Cavity/Oropharynx - Tongue - no dryness observed. Oral Mucosa - no cyanosis observed. Hypopharynx - no evidence of airway distress observed.  Chest and Lung Exam Inspection Movements - Normal and Symmetrical. Accessory muscles - No use of accessory muscles in breathing. Palpation Palpation of the chest reveals - Non-tender. Auscultation Breath sounds - Normal and Clear.  Cardiovascular Auscultation Rhythm - Regular. Murmurs & Other Heart Sounds - Auscultation of the heart reveals - No Murmurs and No Systolic Clicks.  Abdomen Inspection Inspection of the abdomen reveals - No Visible peristalsis and No Abnormal pulsations. Umbilicus - No Bleeding, No Urine drainage. Palpation/Percussion Palpation and Percussion of the abdomen reveal - Soft, Non Tender, No Rebound tenderness, No Rigidity (guarding) and No Cutaneous hyperesthesia. Note: Mildly obese. Muscular. Soft.  Obvious hernia at umbilicus. 2 cm. Sensitive. Reducible.   Male Genitourinary Sexual Maturity Tanner 5 - Adult hair pattern and Adult penile size and shape. Note: Mild impulses at bilateral groins but no definite evidence of inguinal hernia. Testes and epididymides normal. Normal external male genitalia. Circumcised.   Peripheral Vascular Upper Extremity Inspection - Left - No Cyanotic nailbeds, Not Ischemic. Right - No Cyanotic nailbeds, Not Ischemic.  Neurologic Neurologic evaluation reveals -normal attention span and ability  to concentrate, able to name objects and repeat phrases. Appropriate fund of knowledge , normal sensation and normal coordination. Mental Status Affect - not angry, not paranoid. Cranial Nerves-Normal Bilaterally. Gait-Normal.  Neuropsychiatric Mental status exam performed with findings of-able to articulate well with normal speech/language, rate, volume and coherence, thought content normal with ability to perform basic computations and apply abstract reasoning and no evidence of hallucinations, delusions, obsessions or homicidal/suicidal ideation.  Musculoskeletal Global Assessment Spine, Ribs and Pelvis - no instability, subluxation or laxity. Right Upper Extremity - no instability, subluxation or laxity.  Lymphatic Head &  Neck  General Head & Neck Lymphatics: Bilateral - Description - No Localized lymphadenopathy. Axillary  General Axillary Region: Bilateral - Description - No Localized lymphadenopathy. Femoral & Inguinal  Generalized Femoral & Inguinal Lymphatics: Left - Description - No Localized lymphadenopathy. Right - Description - No Localized lymphadenopathy.    Assessment & Plan [name](Hurbert Duran C. Kolbee Stallman MD; 04/17/2014 2:37 PM)  UMBILICAL HERNIA WITHOUT OBSTRUCTION AND WITHOUT GANGRENE (553.1  K42.9) Impression: All not giant, it is sensitive and becoming increasingly more symptomatic. I'm inclined to recommend repair. Given size and being Young athletic male, risk of recurrence higher with primary repair only. For plan laparoscopic underlay repair with mesh. Probably can still be outpatient surgery.  He notes is very busy right now and he would like to hold off on surgery for a few months until he can get enough time for recovery. We gave him information. He will call us most likely next month to discuss surgery. He is tentatively thinking late April.  Current Plans Schedule for Surgery Pt Education - [facility] Hernia Post-Op HCI (Shawnta Schlegel): discussed with patient and provided  information. Pt Education - [facility] Pain Control (Cherell Colvin) Instructions: Consider surgery to repair hernia bellybutton. Laparoscopically with mesh. It should be outpatient surgery.  Please call us when you're ready to consider surgery. Ideally give 6 weeks - 3 month noticd so we can have a better chance of scheduling to your needs.  Ardeth SportsmanSteven C. Chantrice Hagg, M.D., F.A.C.S. Gastrointestinal and Minimally Invasive Surgery [facility] [address] [place], [place] [id number] [phone] Main / Paging

## 2014-06-05 ENCOUNTER — Encounter: Payer: Self-pay | Admitting: Cardiology

## 2014-06-05 ENCOUNTER — Other Ambulatory Visit: Payer: Self-pay | Admitting: *Deleted

## 2014-06-05 ENCOUNTER — Ambulatory Visit (INDEPENDENT_AMBULATORY_CARE_PROVIDER_SITE_OTHER): Payer: BLUE CROSS/BLUE SHIELD | Admitting: Cardiology

## 2014-06-05 VITALS — BP 130/84 | HR 90 | Ht 68.0 in | Wt 216.0 lb

## 2014-06-05 DIAGNOSIS — I10 Essential (primary) hypertension: Secondary | ICD-10-CM

## 2014-06-05 LAB — BASIC METABOLIC PANEL
BUN: 13 mg/dL (ref 6–23)
CALCIUM: 9.7 mg/dL (ref 8.4–10.5)
CO2: 33 meq/L — AB (ref 19–32)
Chloride: 101 mEq/L (ref 96–112)
Creatinine, Ser: 1.02 mg/dL (ref 0.40–1.50)
GFR: 105.79 mL/min (ref >60.00)
Glucose, Bld: 111 mg/dL — ABNORMAL HIGH (ref 70–99)
Potassium: 3.9 mEq/L (ref 3.5–5.1)
SODIUM: 138 meq/L (ref 135–145)

## 2014-06-05 NOTE — Patient Instructions (Signed)
Will obtain labs today and call you with the results (bmet)  Your physician recommends that you continue on your current medications as directed. Please refer to the Current Medication list given to you today.  Your physician wants you to follow-up in: 6 month ov You will receive a reminder letter in the mail two months in advance. If you don't receive a letter, please call our office to schedule the follow-up appointment.

## 2014-06-05 NOTE — Progress Notes (Signed)
[address] [place], [place]  [address] Phone: [phone] Fax:  [phone]  Date:  06/05/2014   ID:  Dennis DecantBenedict Schulke, DOB 1976-02-04, MRN [id number]  PCP:  Sissy HoffSWAYNE,[name], MD   History of Present Illness: Dennis DecantBenedict Garza is a 39 y.o. male with difficult to control hypertension, prior Prinzmetal's angina, migraines here for followup.  [place] was seen in [place] on 09/10/13 with chest pain in the emergency department and discharged early in the morning. Troponins were normal. Nitroglycerin did not relieve discomfort. He is evaluated by cardiology. No dynamic EKG changes. Does not tolerate him Emeline DarlingGore as this worsens his headaches. Heart catheterization in 2010 showed [facility]. These episodes tend to occur with stress. Pain was described as sharp, stabbing, 7/10 then down to 3/10. Left chest under nipple no radiation. Underwent cardiac stress test, exercised for 9 minutes and 30 seconds blood pressure was hypertensive response. Exam was terminated due to chest. Inferior T wave inversion was seen with exercise, nonspecific. Normal stress echocardiogram portion. Renal duplex was recommended. Creatinine 1.07.  Child. Little girl. His boy Toby   Changed to coreg. Doing better. No CP, no SOB. Continuing to work on weight loss. He is down about 4 pounds. His main goal is 190. Trying to decrease carbohydrates. Increase exercise.  Wt Readings from Last 3 Encounters:  06/05/14 216 lb (97.977 kg)  12/24/13 220 lb (99.791 kg)  10/23/13 211 lb (95.709 kg)     Past Medical History  Diagnosis Date  . Hypertension   . Migraine   . Hypercholesterolemia   . Myocardial infarction     "probably"  EKG always Abdnmormal"  Cath clean.  . Lung mass     ,Benign  . Allergic rhinitis   . Hypercholesterolemia     Past Surgical History  Procedure Laterality Date  . Lung biopsy  2009    right  . Cardiac catherization  2010  . [place]  . Muscle biopsy  10/21/2011    Procedure: MUSCLE BIOPSY;  Surgeon: Ardeth SportsmanSteven C. Gross, MD;  Location: [facility] OR;  Service: General;  Laterality: Left;    Current Outpatient Prescriptions  Medication Sig Dispense Refill  . ACETAMINOPHEN-BUTALBITAL 50-325 MG TABS Take 1 tablet by mouth as needed. No more than twice a week.    . carvedilol (COREG) 12.5 MG tablet Take 1 tablet (12.5 mg total) by mouth 2 (two) times daily. 60 tablet 11  . cyclobenzaprine (FLEXERIL) 10 MG tablet Take 10 mg by mouth 3 (three) times daily as needed for muscle spasms.    . divalproex (DEPAKOTE) 250 MG DR tablet Take 2 tablets by mouth 2 (two) times daily.    Marland Kitchen. EPINEPHrine (EPIPEN JR) 0.15 MG/0.3ML injection Inject 0.15 mg into the muscle as needed. For allergic reaction    . HYDROcodone-acetaminophen (VICODIN) 5-500 MG per tablet Take 1 tablet by mouth every 6 (six) hours as needed. For pain    . ibuprofen (ADVIL,MOTRIN) 800 MG tablet Take 800 mg by mouth as needed for pain.    . nitroGLYCERIN (NITROSTAT) 0.4 MG SL tablet Place 1 tablet (0.4 mg total) under the tongue every 5 (five) minutes as needed. For chest pain. 25 tablet 3  . olmesartan-hydrochlorothiazide (BENICAR HCT) 40-25 MG per tablet Take 1 tablet by mouth daily. 90 tablet 1  . spironolactone (ALDACTONE) 25 MG tablet Take 1/2 tablet daily for 1 week.After 1 week if blood pressure still  elevated take 1 tablet daily. (Patient taking differently: Take 25 mg by mouth once. Take 1/2 tablet daily for 1 week.After 1 week if blood pressure still elevated take 1 tablet daily.) 30 tablet 6  . verapamil (COVERA HS) 240 MG (CO) 24 hr tablet Take 1 tablet (240 mg total) by mouth at bedtime. 90 tablet 1   No current facility-administered medications for this visit.    Allergies:    Allergies  Allergen Reactions  . Imdur [Isosorbide] Other (See Comments)    Hypotension  . Lisinopril Other (See Comments)    Hypotension  . Shellfish Allergy Anaphylaxis    Requires  epipen  . Triptans Shortness Of Breath and Other (See Comments)    Will cause BP to drop, also will cause chest pains.  . Ibuprofen   . Chloroquine Itching    Social History:  The patient  reports that he has never smoked. He has never used smokeless tobacco. He reports that he drinks alcohol. He reports that he does not use illicit drugs.   ROS:  Please see the history of present illness.   Denies any fevers, chills, orthopnea, PND    PHYSICAL EXAM: VS:  BP 130/84 mmHg  Pulse 90  Ht  (1.727 m)  Wt 216 lb (97.977 kg)  BMI 32.85 kg/m2 Well nourished, well developed, in no acute distress HEENT: normal Neck: no JVD Cardiac:  normal S1, S2; RRR; no murmur Lungs:  clear to auscultation bilaterally, no wheezing, rhonchi or rales Abd: soft, nontender, no hepatomegaly Ext: no edema Skin: warm and dry Neuro: no focal abnormalities noted  EKG:  12/24/13-sinus rhythm, 78, nonspecific T-wave changes, likely secondary to hypertrophy with T wave inversion in 3, aVF.  Sinus rhythm, 80, T wave inversion in 3, aVF, slightly more prominent than prior EKG but still seen on prior EKG, likely LVH with repolarization abnormality.    Renal duplex 11/03/13-reassuring, no renal artery stenosis Lab work, medical records, stress test reviewed from [place].  ASSESSMENT AND PLAN:  1. Hypertension-doing well currently. I will check basic metabolic profile. Difficult to control but improved. Multidrug regimen. Renal ultrasound duplex reassuring. He is tolerating spironolactone well. . Metoprolol over to carvedilol. Doing better. He likes carvedilol.  Blood pressure still remains under control. Not low.  Check BMET.  2. Prinzmetal's angina-adding carvedilol.. Verapamil and nitroglycerin, carvedilol. Does not tolerate Imdur well. Headaches. Stress echocardiogram 6/15-normal. He asked me previously if he thought he needed a catheterization and I do not believe that he needs this currently. 3. EKG-T waves noted  on EKG, likely repolarization variant. These were seen on prior EKG. 4. 6 month f/u.  Signed, Donato Schultz, MD [facility]  06/05/2014 9:40 AM

## 2014-06-06 ENCOUNTER — Other Ambulatory Visit (INDEPENDENT_AMBULATORY_CARE_PROVIDER_SITE_OTHER): Payer: BLUE CROSS/BLUE SHIELD

## 2014-06-06 DIAGNOSIS — E785 Hyperlipidemia, unspecified: Secondary | ICD-10-CM

## 2014-06-06 LAB — HEPATIC FUNCTION PANEL
ALK PHOS: 27 U/L — AB (ref 39–117)
ALT: 22 U/L (ref 0–53)
AST: 21 U/L (ref 0–37)
Albumin: 4.7 g/dL (ref 3.5–5.2)
Bilirubin, Direct: 0.1 mg/dL (ref 0.0–0.3)
Total Bilirubin: 0.4 mg/dL (ref 0.2–1.2)
Total Protein: 7.6 g/dL (ref 6.0–8.3)

## 2014-06-06 LAB — LIPID PANEL
CHOL/HDL RATIO: 6
CHOLESTEROL: 177 mg/dL (ref 0–200)
HDL: 31.1 mg/dL — AB (ref >39.00)
LDL Cholesterol: 108 mg/dL — ABNORMAL HIGH (ref 0–99)
NonHDL: 145.9
Triglycerides: 190 mg/dL — ABNORMAL HIGH (ref 0.0–149.0)
VLDL: 38 mg/dL (ref 0.0–40.0)

## 2014-06-14 ENCOUNTER — Telehealth: Payer: Self-pay | Admitting: Cardiology

## 2014-06-14 NOTE — Telephone Encounter (Signed)
Reviewed results of lipid profile with pt who states understanding.

## 2014-06-14 NOTE — Telephone Encounter (Signed)
New message  Pt returning Rn's phone call, please call back and discuss.

## 2014-07-05 ENCOUNTER — Other Ambulatory Visit: Payer: Self-pay

## 2014-07-05 MED ORDER — VERAPAMIL HCL 240 MG (CO) PO TB24: 240.0000 mg | ORAL_TABLET | Freq: Every day | ORAL | Status: DC

## 2014-09-14 ENCOUNTER — Telehealth: Payer: Self-pay | Admitting: Internal Medicine

## 2014-09-14 DIAGNOSIS — I10 Essential (primary) hypertension: Secondary | ICD-10-CM

## 2014-09-14 MED ORDER — SPIRONOLACTONE 25 MG PO TABS: 25.0000 mg | ORAL_TABLET | Freq: Every day | ORAL | Status: DC

## 2014-09-14 NOTE — Telephone Encounter (Signed)
Called for refill of the medication since he is out of it. Patient taking it safely previously. He does not clearly know why is he taking it. Will discuss with his cardiologist.

## 2014-09-15 ENCOUNTER — Other Ambulatory Visit: Payer: Self-pay | Admitting: Nurse Practitioner

## 2014-09-15 DIAGNOSIS — I10 Essential (primary) hypertension: Secondary | ICD-10-CM

## 2014-09-15 MED ORDER — SPIRONOLACTONE 25 MG PO TABS: 25.0000 mg | ORAL_TABLET | Freq: Every day | ORAL | Status: DC

## 2014-09-16 NOTE — Telephone Encounter (Signed)
HTN Dennis SchultzSKAINS, Odessa Morren, MD

## 2014-10-29 ENCOUNTER — Other Ambulatory Visit: Payer: Self-pay

## 2014-10-29 DIAGNOSIS — I10 Essential (primary) hypertension: Secondary | ICD-10-CM

## 2014-10-29 MED ORDER — CARVEDILOL 12.5 MG PO TABS: 12.5000 mg | ORAL_TABLET | Freq: Two times a day (BID) | ORAL | Status: DC

## 2014-10-29 MED ORDER — VERAPAMIL HCL 240 MG (CO) PO TB24: 240.0000 mg | ORAL_TABLET | Freq: Every day | ORAL | Status: DC

## 2014-10-29 MED ORDER — OLMESARTAN MEDOXOMIL-HCTZ 40-25 MG PO TABS: 1.0000 | ORAL_TABLET | Freq: Every day | ORAL | Status: DC

## 2014-10-29 MED ORDER — SPIRONOLACTONE 25 MG PO TABS: 25.0000 mg | ORAL_TABLET | Freq: Every day | ORAL | Status: DC

## 2014-10-30 ENCOUNTER — Other Ambulatory Visit: Payer: Self-pay

## 2014-10-30 DIAGNOSIS — I10 Essential (primary) hypertension: Secondary | ICD-10-CM

## 2014-10-30 MED ORDER — NITROGLYCERIN 0.4 MG SL SUBL: 0.4000 mg | SUBLINGUAL_TABLET | SUBLINGUAL | Status: AC | PRN

## 2014-10-30 MED ORDER — OLMESARTAN MEDOXOMIL-HCTZ 40-25 MG PO TABS: 1.0000 | ORAL_TABLET | Freq: Every day | ORAL | Status: AC

## 2014-10-30 MED ORDER — CARVEDILOL 12.5 MG PO TABS: 12.5000 mg | ORAL_TABLET | Freq: Two times a day (BID) | ORAL | Status: AC

## 2014-10-30 MED ORDER — SPIRONOLACTONE 25 MG PO TABS: 25.0000 mg | ORAL_TABLET | Freq: Every day | ORAL | Status: AC

## 2014-10-30 MED ORDER — VERAPAMIL HCL 240 MG (CO) PO TB24: 240.0000 mg | ORAL_TABLET | Freq: Every day | ORAL | Status: AC

## 2014-12-12 ENCOUNTER — Ambulatory Visit (INDEPENDENT_AMBULATORY_CARE_PROVIDER_SITE_OTHER): Payer: PRIVATE HEALTH INSURANCE | Admitting: Cardiology

## 2014-12-12 ENCOUNTER — Encounter: Payer: Self-pay | Admitting: Cardiology

## 2014-12-12 VITALS — BP 130/82 | HR 72 | Ht 69.0 in | Wt 216.8 lb

## 2014-12-12 DIAGNOSIS — I10 Essential (primary) hypertension: Secondary | ICD-10-CM | POA: Diagnosis not present

## 2014-12-12 DIAGNOSIS — F4321 Adjustment disorder with depressed mood: Secondary | ICD-10-CM

## 2014-12-12 DIAGNOSIS — R9431 Abnormal electrocardiogram [ECG] [EKG]: Secondary | ICD-10-CM | POA: Diagnosis not present

## 2014-12-12 DIAGNOSIS — I201 Angina pectoris with documented spasm: Secondary | ICD-10-CM

## 2014-12-12 LAB — BASIC METABOLIC PANEL
BUN: 16 mg/dL (ref 6–23)
CHLORIDE: 99 meq/L (ref 96–112)
CO2: 31 mEq/L (ref 19–32)
Calcium: 9.8 mg/dL (ref 8.4–10.5)
Creatinine, Ser: 1 mg/dL (ref 0.40–1.50)
GFR: 107.92 mL/min (ref >60.00)
GLUCOSE: 81 mg/dL (ref 70–99)
POTASSIUM: 3.8 meq/L (ref 3.5–5.1)
Sodium: 137 mEq/L (ref 135–145)

## 2014-12-12 NOTE — Progress Notes (Signed)
[address] [place], [place]  [address] Phone: [phone] Fax:  [phone]  Date:  12/12/2014   ID:  Dennis Garza, DOB 1975/06/23, MRN [id number]  PCP:  [name], MD   History of Present Illness: Dennis Garza is a 39 y.o. male with difficult to control hypertension, prior Prinzmetal's angina, migraines here for followup.  He was seen in [place] on 09/10/13 with chest pain in the emergency department and discharged early in the morning. Troponins were normal. Nitroglycerin did not relieve discomfort. He is evaluated by cardiology. No dynamic EKG changes. Does not tolerate him Emeline Darling as this worsens his headaches. Heart catheterization in 2010 showed normal coronary arteries at [facility]. These episodes tend to occur with stress. Pain was described as sharp, stabbing, 7/10 then down to 3/10. Left chest under nipple no radiation. Underwent cardiac stress test, exercised for 9 minutes and 30 seconds blood pressure was hypertensive response. Exam was terminated due to chest. Inferior T wave inversion was seen with exercise, nonspecific. Normal stress echocardiogram portion. Renal duplex was recommended. Creatinine 1.07.  Child. Little girl. His boy Toby   Changed to coreg. Doing better. No CP, no SOB. Continuing to work on weight loss. He is down about 4 pounds. His main goal is 190. Trying to decrease carbohydrates. Increase exercise.  Wt Readings from Last 3 Encounters:  12/12/14 216 lb 12.8 oz (98.34 kg)  06/05/14 216 lb (97.977 kg)  12/24/13 220 lb (99.791 kg)     Past Medical History  Diagnosis Date  . Hypertension   . Migraine   . Hypercholesterolemia   . Myocardial infarction     "probably"  EKG always Abdnmormal"  Cath clean.  . Lung mass     ,Benign  . Allergic rhinitis   . Hypercholesterolemia     Past Surgical History  Procedure Laterality Date  . Lung biopsy  2009    right  . Cardiac catherization  2010  . [place]   [place]    [place]  . Muscle biopsy  10/21/2011    Procedure: MUSCLE BIOPSY;  Surgeon: [name], MD;  Location: [facility] OR;  Service: General;  Laterality: Left;    Current Outpatient Prescriptions  Medication Sig Dispense Refill  . ACETAMINOPHEN-BUTALBITAL 50-325 MG TABS Take 1 tablet by mouth as needed. No more than twice a week.    . carvedilol (COREG) 12.5 MG tablet Take 1 tablet (12.5 mg total) by mouth 2 (two) times daily. 60 tablet 6  . cyclobenzaprine (FLEXERIL) 10 MG tablet Take 10 mg by mouth 3 (three) times daily as needed for muscle spasms.    . divalproex (DEPAKOTE) 250 MG DR tablet Take 2 tablets by mouth 2 (two) times daily.    Marland Kitchen EPINEPHrine (EPIPEN JR) 0.15 MG/0.3ML injection Inject 0.15 mg into the muscle as needed. For allergic reaction    . HYDROcodone-acetaminophen (VICODIN) 5-500 MG per tablet Take 1 tablet by mouth every 6 (six) hours as needed. For pain    . ibuprofen (ADVIL,MOTRIN) 800 MG tablet Take 800 mg by mouth as needed for pain.    . nitroGLYCERIN (NITROSTAT) 0.4 MG SL tablet Place 1 tablet (0.4 mg total) under the tongue every 5 (five) minutes as needed. For chest pain. 25 tablet 3  . olmesartan-hydrochlorothiazide (BENICAR HCT) 40-25 MG per tablet Take 1 tablet by mouth daily. 90 tablet 1  . spironolactone (ALDACTONE) 25 MG tablet Take 1 tablet (25 mg total) by mouth daily.  90 tablet 1  . verapamil (COVERA HS) 240 MG (CO) 24 hr tablet Take 1 tablet (240 mg total) by mouth at bedtime. 90 tablet 1   No current facility-administered medications for this visit.    Allergies:    Allergies  Allergen Reactions  . Imdur [Isosorbide] Other (See Comments)    Hypotension  . Lisinopril Other (See Comments)    Hypotension  . Shellfish Allergy Anaphylaxis    Requires epipen  . Triptans Shortness Of Breath and Other (See Comments)    Will cause BP to drop, also will cause chest pains.  . Chloroquine Itching    Social History:  The patient  reports that he has  never smoked. He has never used smokeless tobacco. He reports that he drinks alcohol. He reports that he does not use illicit drugs.   ROS:  Please see the history of present illness.   Denies any fevers, chills, orthopnea, PND    PHYSICAL EXAM: VS:  BP 130/82 mmHg  Pulse 72  Ht  (1.753 m)  Wt 216 lb 12.8 oz (98.34 kg)  BMI 32.00 kg/m2 Well nourished, well developed, in no acute distress HEENT: normal Neck: no JVD Cardiac:  normal S1, S2; RRR; no murmur Lungs:  clear to auscultation bilaterally, no wheezing, rhonchi or rales Abd: soft, nontender, no hepatomegaly Ext: no edema Skin: warm and dry Neuro: no focal abnormalities noted  EKG:  today 12/12/14-sinus rhythm, 76, T wave inversions inferior lateral, J-point elevation V2 V3, no significant change in prior EKG personally reviewed-12/24/13-sinus rhythm, 78, nonspecific T-wave changes, likely secondary to hypertrophy with T wave inversion in 3, aVF.  Sinus rhythm, 80, T wave inversion in 3, aVF, slightly more prominent than prior EKG but still seen on prior EKG, likely LVH with repolarization abnormality.    Renal duplex 11/03/13-reassuring, no renal artery stenosis Lab work, medical records, stress test reviewed from [place].  LDL 108, HDL 31, triglycerides 190, ALT 22, potassium 3.9  ASSESSMENT AND PLAN:  1. Hypertension-doing well currently. Creat 0.9 Difficult to control in the past but improved. Multidrug regimen. Renal ultrasound duplex reassuring. He is tolerating spironolactone well.  Metoprolol moved over to carvedilol. Doing better. He likes carvedilol. I will check a basic metabolic profile since he is on Aldactone.  2. Prinzmetal's angina-adding carvedilol.. Verapamil and nitroglycerin, carvedilol. Does not tolerate Imdur well. Headaches. Stress echocardiogram 6/15-normal. He asked me previously if he thought he needed a catheterization and I do not believe that he needs this currently. 3. EKG-T waves noted on EKG,  likely repolarization variant. These were seen on prior EKG. 4. He is currently grieving the loss of his father, CLL, hepatomegaly, confusion, coma. [place]. His wife is currently in [facility] with his 2 children. He is working in [employer]. 5. 6 month f/u.  Signed, Donato Schultz, MD [facility]  12/12/2014 2:49 PM

## 2014-12-12 NOTE — Patient Instructions (Addendum)
Medication Instructions:  Your physician recommends that you continue on your current medications as directed. Please refer to the Current Medication list given to you today.  Labwork: bmet  Testing/Procedures: none  Follow-Up: Your physician wants you to follow-up in: 6 month ov You will receive a reminder letter in the mail two months in advance. If you don't receive a letter, please call our office to schedule the follow-up appointment.

## 2015-07-01 ENCOUNTER — Telehealth: Payer: Self-pay | Admitting: Cardiology

## 2015-07-01 NOTE — Telephone Encounter (Signed)
New Message:  Pt called in stating that he is schedule to take a stress test on 3/13 in [place] and the physician is recommending that he hold his Coreg for 24 hours prior. He is apprehensive because it helps control his BP. Please help assist pt

## 2015-07-02 NOTE — Telephone Encounter (Signed)
Per pt - HR has been from 80 to 103 bpm recently and he is concerned about holding it prior to the GXT he has scheduled in [place] tomorrow.  Advised he will need to clarify with the MD who ordered and is going to be doing the testing to determine if he should take it or not however having pts to hold beta-blockers is general protocol.  He states understanding and will contact that office.
